# Patient Record
Sex: Female | Born: 1985
Health system: Southern US, Community
[De-identification: ages and names within clinical notes are randomized; demographics above are authoritative.]

## PROBLEM LIST (undated history)

## (undated) DIAGNOSIS — D649 Anemia, unspecified: Secondary | ICD-10-CM

---

## 2012-11-13 ENCOUNTER — Emergency Department (HOSPITAL_BASED_OUTPATIENT_CLINIC_OR_DEPARTMENT_OTHER)
Admission: EM | Admit: 2012-11-13 | Discharge: 2012-11-13 | Disposition: A | Discharge status: Home / Self Care | Payer: Self-pay | Attending: Emergency Medicine | Admitting: Emergency Medicine

## 2012-11-13 ENCOUNTER — Encounter (HOSPITAL_BASED_OUTPATIENT_CLINIC_OR_DEPARTMENT_OTHER): Payer: Self-pay | Admitting: *Deleted

## 2012-11-13 DIAGNOSIS — IMO0001 Reserved for inherently not codable concepts without codable children: Secondary | ICD-10-CM | POA: Insufficient documentation

## 2012-11-13 DIAGNOSIS — L259 Unspecified contact dermatitis, unspecified cause: Secondary | ICD-10-CM | POA: Insufficient documentation

## 2012-11-13 DIAGNOSIS — N3090 Cystitis, unspecified without hematuria: Secondary | ICD-10-CM | POA: Insufficient documentation

## 2012-11-13 DIAGNOSIS — L239 Allergic contact dermatitis, unspecified cause: Secondary | ICD-10-CM

## 2012-11-13 DIAGNOSIS — S30860A Insect bite (nonvenomous) of lower back and pelvis, initial encounter: Secondary | ICD-10-CM | POA: Insufficient documentation

## 2012-11-13 DIAGNOSIS — Y939 Activity, unspecified: Secondary | ICD-10-CM | POA: Insufficient documentation

## 2012-11-13 DIAGNOSIS — W57XXXA Bitten or stung by nonvenomous insect and other nonvenomous arthropods, initial encounter: Secondary | ICD-10-CM | POA: Insufficient documentation

## 2012-11-13 DIAGNOSIS — R3915 Urgency of urination: Secondary | ICD-10-CM | POA: Insufficient documentation

## 2012-11-13 DIAGNOSIS — S90569A Insect bite (nonvenomous), unspecified ankle, initial encounter: Secondary | ICD-10-CM | POA: Insufficient documentation

## 2012-11-13 DIAGNOSIS — Y9289 Other specified places as the place of occurrence of the external cause: Secondary | ICD-10-CM | POA: Insufficient documentation

## 2012-11-13 DIAGNOSIS — R300 Dysuria: Secondary | ICD-10-CM | POA: Insufficient documentation

## 2012-11-13 DIAGNOSIS — R350 Frequency of micturition: Secondary | ICD-10-CM | POA: Insufficient documentation

## 2012-11-13 DIAGNOSIS — L299 Pruritus, unspecified: Secondary | ICD-10-CM | POA: Insufficient documentation

## 2012-11-13 DIAGNOSIS — Z862 Personal history of diseases of the blood and blood-forming organs and certain disorders involving the immune mechanism: Secondary | ICD-10-CM | POA: Insufficient documentation

## 2012-11-13 HISTORY — DX: Anemia, unspecified: D64.9

## 2012-11-13 LAB — URINE MICROSCOPIC-ADD ON

## 2012-11-13 LAB — URINALYSIS, ROUTINE W REFLEX MICROSCOPIC
Bilirubin Urine: NEGATIVE
Ketones, ur: 15 mg/dL — AB
Nitrite: NEGATIVE
Protein, ur: NEGATIVE mg/dL
pH: 6.5 (ref 5.0–8.0)

## 2012-11-13 MED ORDER — PHENAZOPYRIDINE HCL 200 MG PO TABS: 200.0000 mg | ORAL_TABLET | Freq: Three times a day (TID) | ORAL | Status: DC

## 2012-11-13 MED ORDER — KETOROLAC TROMETHAMINE 60 MG/2ML IM SOLN: 60.0000 mg | Freq: Once | INTRAMUSCULAR | Status: DC

## 2012-11-13 MED ORDER — SULFAMETHOXAZOLE-TRIMETHOPRIM 800-160 MG PO TABS: 1.0000 | ORAL_TABLET | Freq: Two times a day (BID) | ORAL | Status: DC

## 2012-11-13 MED ORDER — CETIRIZINE-PSEUDOEPHEDRINE ER 5-120 MG PO TB12: 1.0000 | ORAL_TABLET | Freq: Every day | ORAL | Status: DC

## 2012-11-13 NOTE — ED Provider Notes (Signed)
History     CSN: [id number]  Arrival date & time 11/13/12  2107   First MD Initiated Contact with Patient 11/13/12 2238      Chief Complaint  Patient presents with  . Rash    (Consider location/radiation/quality/duration/timing/severity/associated sxs/prior treatment) Patient is a 27 y.o. female presenting with rash. The history is provided by the patient.  Rash Pain severity:  No pain Chronicity:  New Relieved by:  Nothing Worsened by:  Nothing tried Ineffective treatments:  None tried Associated symptoms: dysuria   Associated symptoms: no chills, no fever, no nausea, no vaginal bleeding, no vaginal discharge and no vomiting    Maureen Miller is a 27 y.o. female who presents to the ED with a rash. The rash started a few days ago. She has been outside a lot and thinks she has insect bites. She complains of itching. The rash is located on the arms, legs and abdomen. Patient also complains of frequent urination, urgency and dysuria. She request check for UTI.  Past Medical History  Diagnosis Date  . Anemia     History reviewed. No pertinent past surgical history.  History reviewed. No pertinent family history.  History  Substance Use Topics  . Smoking status: Never Smoker   . Smokeless tobacco: Not on file  . Alcohol Use: No    OB History   Grav Para Term Preterm Abortions TAB SAB Ect Mult Living                  Review of Systems  Constitutional: Negative for fever and chills.  HENT: Negative for neck pain.   Gastrointestinal: Negative for nausea, vomiting and abdominal pain.  Genitourinary: Positive for dysuria, urgency and frequency. Negative for vaginal bleeding and vaginal discharge.  Skin: Positive for rash.  Neurological: Negative for headaches.  Psychiatric/Behavioral: The patient is not nervous/anxious.     Allergies  Review of patient's allergies indicates no known allergies.  Home Medications  No current outpatient prescriptions on file.  BP  112/74  Pulse 80  Temp(Src) 97.4 F (36.3 C) (Oral)  Resp 18  Ht 5\' 6"  (1.676 m)  Wt 175 lb (79.379 kg)  BMI 28.26 kg/m2  SpO2 100%  LMP 09/29/2012  Physical Exam  Nursing note and vitals reviewed. Constitutional: She is oriented to person, place, and time. She appears well-developed and well-nourished. No distress.  HENT:  Head: Normocephalic and atraumatic.  Eyes: Conjunctivae and EOM are normal.  Neck: Normal range of motion. Neck supple.  Cardiovascular: Normal rate and regular rhythm.   Pulmonary/Chest: Effort normal and breath sounds normal.  Musculoskeletal:  Raised red areas arms legs and abdomen consistent with allergic reaction to insect bites. No signs of infection.   Neurological: She is alert and oriented to person, place, and time. No cranial nerve deficit.  Skin: Skin is warm and dry.   Results for orders placed during the hospital encounter of 11/13/12 (from the past 24 hour(s))  URINALYSIS, ROUTINE W REFLEX MICROSCOPIC     Status: Abnormal   Collection Time    11/13/12 10:54 PM      Result Value Range   Color, Urine YELLOW  YELLOW   APPearance CLEAR  CLEAR   Specific Gravity, Urine 1.017  1.005 - 1.030   pH 6.5  5.0 - 8.0   Glucose, UA NEGATIVE  NEGATIVE mg/dL   Hgb urine dipstick SMALL (*) NEGATIVE   Bilirubin Urine NEGATIVE  NEGATIVE   Ketones, ur 15 (*) NEGATIVE mg/dL  Protein, ur NEGATIVE  NEGATIVE mg/dL   Urobilinogen, UA 0.2  0.0 - 1.0 mg/dL   Nitrite NEGATIVE  NEGATIVE   Leukocytes, UA SMALL (*) NEGATIVE  URINE MICROSCOPIC-ADD ON     Status: Abnormal   Collection Time    11/13/12 10:54 PM      Result Value Range   Squamous Epithelial / LPF FEW (*) RARE   WBC, UA 3-6  <3 WBC/hpf   RBC / HPF 0-2  <3 RBC/hpf   Bacteria, UA FEW (*) RARE    ED Course  Procedures (including critical care time)   MDM  27 y.o. female with allergic dermitis due to insect bites. UTI symptoms. Will treat allergic dermitis and UTI.  Discussed with the patient  clinical and lab findings and plan of care. All questioned fully answered. She will return if any problems arise.    Medication List    TAKE these medications       cetirizine-pseudoephedrine 5-120 MG per tablet  Commonly known as:  ZYRTEC-D  Take 1 tablet by mouth daily.     phenazopyridine 200 MG tablet  Commonly known as:  PYRIDIUM  Take 1 tablet (200 mg total) by mouth 3 (three) times daily.     sulfamethoxazole-trimethoprim 800-160 MG per tablet  Commonly known as:  SEPTRA DS  Take 1 tablet by mouth every 12 (twelve) hours.               [place], [place] 11/14/12 [phone]

## 2012-11-13 NOTE — ED Notes (Addendum)
Reddened, raised areas on arms and torso for 3 days. + itching. Also c/o burning when urinating, urinary frequency and lower back pain.

## 2012-11-13 NOTE — ED Notes (Addendum)
Raised red areas to body x 3-4 days. +itching. Grandmother has similar rash "but hers are smaller"

## 2012-11-14 NOTE — ED Provider Notes (Signed)
Medical screening examination/treatment/procedure(s) were performed by non-physician practitioner and as supervising physician I was immediately available for consultation/collaboration.   Carleene Cooper III, MD 11/14/12 1331

## 2012-11-15 LAB — URINE CULTURE: Colony Count: NO GROWTH

## 2013-04-07 ENCOUNTER — Emergency Department (HOSPITAL_BASED_OUTPATIENT_CLINIC_OR_DEPARTMENT_OTHER)
Admission: EM | Admit: 2013-04-07 | Discharge: 2013-04-07 | Disposition: A | Discharge status: Home / Self Care | Payer: Self-pay | Attending: Emergency Medicine | Admitting: Emergency Medicine

## 2013-04-07 ENCOUNTER — Encounter (HOSPITAL_BASED_OUTPATIENT_CLINIC_OR_DEPARTMENT_OTHER): Payer: Self-pay | Admitting: Emergency Medicine

## 2013-04-07 DIAGNOSIS — R102 Pelvic and perineal pain: Secondary | ICD-10-CM

## 2013-04-07 DIAGNOSIS — N949 Unspecified condition associated with female genital organs and menstrual cycle: Secondary | ICD-10-CM | POA: Insufficient documentation

## 2013-04-07 DIAGNOSIS — Z862 Personal history of diseases of the blood and blood-forming organs and certain disorders involving the immune mechanism: Secondary | ICD-10-CM | POA: Insufficient documentation

## 2013-04-07 DIAGNOSIS — N898 Other specified noninflammatory disorders of vagina: Secondary | ICD-10-CM | POA: Insufficient documentation

## 2013-04-07 DIAGNOSIS — R112 Nausea with vomiting, unspecified: Secondary | ICD-10-CM | POA: Insufficient documentation

## 2013-04-07 DIAGNOSIS — Z3202 Encounter for pregnancy test, result negative: Secondary | ICD-10-CM | POA: Insufficient documentation

## 2013-04-07 LAB — BASIC METABOLIC PANEL
CO2: 28 mEq/L (ref 19–32)
Chloride: 101 mEq/L (ref 96–112)
GFR calc Af Amer: >90 mL/min (ref >90)
Potassium: 3.8 mEq/L (ref 3.5–5.1)
Sodium: 139 mEq/L (ref 135–145)

## 2013-04-07 LAB — URINALYSIS, ROUTINE W REFLEX MICROSCOPIC
Bilirubin Urine: NEGATIVE
Glucose, UA: NEGATIVE mg/dL
Ketones, ur: 15 mg/dL — AB
Protein, ur: 30 mg/dL — AB
pH: 6 (ref 5.0–8.0)

## 2013-04-07 LAB — CBC WITH DIFFERENTIAL/PLATELET
Basophils Absolute: 0 10*3/uL (ref 0.0–0.1)
Basophils Relative: 0 % (ref 0–1)
Hemoglobin: 14.2 g/dL (ref 12.0–15.0)
Lymphocytes Relative: 12 % (ref 12–46)
MCH: 28 pg (ref 26.0–34.0)
MCHC: 33.6 g/dL (ref 30.0–36.0)
MCV: 83.4 fL (ref 78.0–100.0)
RBC: 5.07 MIL/uL (ref 3.87–5.11)

## 2013-04-07 LAB — WET PREP, GENITAL
Trich, Wet Prep: NONE SEEN
Yeast Wet Prep HPF POC: NONE SEEN

## 2013-04-07 LAB — URINE MICROSCOPIC-ADD ON

## 2013-04-07 MED ORDER — CEFTRIAXONE SODIUM 250 MG IJ SOLR
250.0000 mg | Freq: Once | INTRAMUSCULAR | Status: AC
  Administered 2013-04-07: 250 mg via INTRAMUSCULAR
  Filled 2013-04-07: qty 250

## 2013-04-07 MED ORDER — DOXYCYCLINE HYCLATE 100 MG PO CAPS: 100.0000 mg | ORAL_CAPSULE | Freq: Two times a day (BID) | ORAL | Status: DC

## 2013-04-07 MED ORDER — ONDANSETRON 4 MG PO TBDP: 4.0000 mg | ORAL_TABLET | Freq: Three times a day (TID) | ORAL | Status: DC | PRN

## 2013-04-07 MED ORDER — MORPHINE SULFATE 4 MG/ML IJ SOLN
4.0000 mg | Freq: Once | INTRAMUSCULAR | Status: AC
  Administered 2013-04-07: 4 mg via INTRAVENOUS
  Filled 2013-04-07: qty 1

## 2013-04-07 MED ORDER — LIDOCAINE HCL (PF) 1 % IJ SOLN
INTRAMUSCULAR | Status: AC
  Administered 2013-04-07: 1 mL
  Filled 2013-04-07: qty 5

## 2013-04-07 MED ORDER — ONDANSETRON HCL 4 MG/2ML IJ SOLN
4.0000 mg | Freq: Once | INTRAMUSCULAR | Status: AC
  Administered 2013-04-07: 4 mg via INTRAVENOUS
  Filled 2013-04-07: qty 2

## 2013-04-07 MED ORDER — SODIUM CHLORIDE 0.9 % IV SOLN
INTRAVENOUS | Status: DC
  Administered 2013-04-07: 1000 mL via INTRAVENOUS

## 2013-04-07 NOTE — ED Provider Notes (Signed)
CSN: [id number]     Arrival date & time 04/07/13  1721 History   First MD Initiated Contact with Patient 04/07/13 1730     Chief Complaint  Patient presents with  . Abdominal Pain   (Consider location/radiation/quality/duration/timing/severity/associated sxs/prior Treatment) Patient is a 27 y.o. female presenting with abdominal pain. The history is provided by the patient. No language interpreter was used.  Abdominal Pain Pain location:  Suprapubic Pain quality: throbbing   Pain radiates to:  Does not radiate Pain severity:  Severe Onset quality:  Sudden Duration:  1 hour Timing:  Constant Progression:  Worsening Chronicity:  New Relieved by:  None tried Worsened by:  Movement and position changes Ineffective treatments:  None tried Associated symptoms: nausea and vomiting   Associated symptoms: no vaginal bleeding and no vaginal discharge     Past Medical History  Diagnosis Date  . Anemia    History reviewed. No pertinent past surgical history. History reviewed. No pertinent family history. History  Substance Use Topics  . Smoking status: Never Smoker   . Smokeless tobacco: Not on file  . Alcohol Use: No   OB History   Grav Para Term Preterm Abortions TAB SAB Ect Mult Living                 Review of Systems  Gastrointestinal: Positive for nausea, vomiting and abdominal pain.  Genitourinary: Positive for pelvic pain. Negative for vaginal bleeding and vaginal discharge.  All other systems reviewed and are negative.    Allergies  Review of patient's allergies indicates no known allergies.  Home Medications  No current outpatient prescriptions on file. BP 123/83  Pulse 90  Temp(Src) 97.8 F (36.6 C) (Oral)  Resp 16  Ht 5\' 7"  (1.702 m)  Wt 160 lb (72.576 kg)  BMI 25.05 kg/m2  SpO2 98%  LMP 03/25/2013 Physical Exam  Nursing note and vitals reviewed. Constitutional: She is oriented to person, place, and time. She appears well-developed and  well-nourished.  HENT:  Head: Normocephalic.  Eyes: Pupils are equal, round, and reactive to light.  Neck: Neck supple.  Cardiovascular: Normal rate and regular rhythm.   Pulmonary/Chest: Effort normal and breath sounds normal.  Abdominal: Soft. Bowel sounds are normal. There is tenderness.  Genitourinary: There is no rash on the right labia. There is no rash on the left labia. Cervix exhibits motion tenderness and discharge. Left adnexum displays tenderness. Vaginal discharge found.  Musculoskeletal: She exhibits no edema and no tenderness.  Lymphadenopathy:    She has no cervical adenopathy.  Neurological: She is alert and oriented to person, place, and time.  Skin: Skin is warm and dry.  Psychiatric: She has a normal mood and affect. Her behavior is normal. Judgment and thought content normal.    ED Course  Procedures (including critical care time   Labs Review Labs Reviewed  URINALYSIS, ROUTINE W REFLEX MICROSCOPIC  PREGNANCY, URINE  CBC WITH DIFFERENTIAL  BASIC METABOLIC PANEL   Imaging Review No results found.  EKG Interpretation   None     Laboratory results reviewed and shared with patient. Patient is sexually active, no protection, monogamous relationship.  Patient with purulent vaginal discharge on exam, mild left adnexal tenderness.  Pending STD labs.  Will cover with rocephin and doxycycline.  Patient to follow-up with her PCP/GYN, or [facility]. MDM   Pelvic pain.   Jimmye Norman, NP 04/07/13 [phone]

## 2013-04-07 NOTE — ED Notes (Signed)
Pt c/o abd pain with n/v/d x 30 mins

## 2013-04-07 NOTE — ED Notes (Signed)
Pelvic cart set up at bedside  

## 2013-04-07 NOTE — ED Notes (Signed)
Pt is unable to void. 

## 2013-04-07 NOTE — ED Notes (Signed)
N/v, abd pain onset 30 minutes pta, diarrhea x 1

## 2013-04-08 LAB — URINE CULTURE: Culture: NO GROWTH

## 2013-04-11 NOTE — ED Provider Notes (Signed)
Medical screening examination/treatment/procedure(s) were performed by non-physician practitioner and as supervising physician I was immediately available for consultation/collaboration.  EKG Interpretation   None        Shelda Jakes, MD 04/11/13 0730

## 2014-09-01 ENCOUNTER — Encounter (HOSPITAL_BASED_OUTPATIENT_CLINIC_OR_DEPARTMENT_OTHER): Payer: Self-pay | Admitting: *Deleted

## 2014-09-01 ENCOUNTER — Emergency Department (HOSPITAL_BASED_OUTPATIENT_CLINIC_OR_DEPARTMENT_OTHER): Payer: Medicaid Other

## 2014-09-01 ENCOUNTER — Emergency Department (HOSPITAL_BASED_OUTPATIENT_CLINIC_OR_DEPARTMENT_OTHER)
Admission: EM | Admit: 2014-09-01 | Discharge: 2014-09-01 | Disposition: A | Discharge status: Home / Self Care | Payer: Medicaid Other | Attending: Emergency Medicine | Admitting: Emergency Medicine

## 2014-09-01 DIAGNOSIS — Z792 Long term (current) use of antibiotics: Secondary | ICD-10-CM | POA: Diagnosis not present

## 2014-09-01 DIAGNOSIS — Y9389 Activity, other specified: Secondary | ICD-10-CM | POA: Diagnosis not present

## 2014-09-01 DIAGNOSIS — Y92410 Unspecified street and highway as the place of occurrence of the external cause: Secondary | ICD-10-CM | POA: Insufficient documentation

## 2014-09-01 DIAGNOSIS — Z862 Personal history of diseases of the blood and blood-forming organs and certain disorders involving the immune mechanism: Secondary | ICD-10-CM | POA: Insufficient documentation

## 2014-09-01 DIAGNOSIS — Y998 Other external cause status: Secondary | ICD-10-CM | POA: Insufficient documentation

## 2014-09-01 DIAGNOSIS — S0990XA Unspecified injury of head, initial encounter: Secondary | ICD-10-CM | POA: Insufficient documentation

## 2014-09-01 DIAGNOSIS — Z79899 Other long term (current) drug therapy: Secondary | ICD-10-CM | POA: Insufficient documentation

## 2014-09-01 DIAGNOSIS — S199XXA Unspecified injury of neck, initial encounter: Secondary | ICD-10-CM | POA: Insufficient documentation

## 2014-09-01 MED ORDER — HYDROCODONE-ACETAMINOPHEN 5-325 MG PO TABS: 1.0000 | ORAL_TABLET | Freq: Four times a day (QID) | ORAL | Status: DC | PRN

## 2014-09-01 MED ORDER — ACETAMINOPHEN 325 MG PO TABS
650.0000 mg | ORAL_TABLET | Freq: Once | ORAL | Status: AC
  Administered 2014-09-01: 650 mg via ORAL
  Filled 2014-09-01: qty 2

## 2014-09-01 NOTE — Discharge Instructions (Signed)
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
## 2014-09-03 ENCOUNTER — Emergency Department (HOSPITAL_BASED_OUTPATIENT_CLINIC_OR_DEPARTMENT_OTHER)
Admission: EM | Admit: 2014-09-03 | Discharge: 2014-09-03 | Disposition: A | Discharge status: Home / Self Care | Payer: Medicaid Other | Attending: Emergency Medicine | Admitting: Emergency Medicine

## 2014-09-03 ENCOUNTER — Emergency Department (HOSPITAL_BASED_OUTPATIENT_CLINIC_OR_DEPARTMENT_OTHER): Payer: Medicaid Other

## 2014-09-03 ENCOUNTER — Encounter (HOSPITAL_BASED_OUTPATIENT_CLINIC_OR_DEPARTMENT_OTHER): Payer: Self-pay | Admitting: *Deleted

## 2014-09-03 DIAGNOSIS — Z3202 Encounter for pregnancy test, result negative: Secondary | ICD-10-CM | POA: Diagnosis not present

## 2014-09-03 DIAGNOSIS — R519 Headache, unspecified: Secondary | ICD-10-CM

## 2014-09-03 DIAGNOSIS — S3992XA Unspecified injury of lower back, initial encounter: Secondary | ICD-10-CM | POA: Diagnosis not present

## 2014-09-03 DIAGNOSIS — R0789 Other chest pain: Secondary | ICD-10-CM

## 2014-09-03 DIAGNOSIS — M549 Dorsalgia, unspecified: Secondary | ICD-10-CM

## 2014-09-03 DIAGNOSIS — R000 Tachycardia, unspecified: Secondary | ICD-10-CM | POA: Insufficient documentation

## 2014-09-03 DIAGNOSIS — Y9389 Activity, other specified: Secondary | ICD-10-CM | POA: Diagnosis not present

## 2014-09-03 DIAGNOSIS — Y92410 Unspecified street and highway as the place of occurrence of the external cause: Secondary | ICD-10-CM | POA: Diagnosis not present

## 2014-09-03 DIAGNOSIS — S29001A Unspecified injury of muscle and tendon of front wall of thorax, initial encounter: Secondary | ICD-10-CM | POA: Insufficient documentation

## 2014-09-03 DIAGNOSIS — Y998 Other external cause status: Secondary | ICD-10-CM | POA: Diagnosis not present

## 2014-09-03 DIAGNOSIS — R51 Headache: Secondary | ICD-10-CM

## 2014-09-03 DIAGNOSIS — Z792 Long term (current) use of antibiotics: Secondary | ICD-10-CM | POA: Insufficient documentation

## 2014-09-03 DIAGNOSIS — S0990XA Unspecified injury of head, initial encounter: Secondary | ICD-10-CM | POA: Diagnosis present

## 2014-09-03 LAB — URINALYSIS, ROUTINE W REFLEX MICROSCOPIC
BILIRUBIN URINE: NEGATIVE
GLUCOSE, UA: NEGATIVE mg/dL
HGB URINE DIPSTICK: NEGATIVE
KETONES UR: NEGATIVE mg/dL
LEUKOCYTES UA: NEGATIVE
NITRITE: NEGATIVE
Protein, ur: NEGATIVE mg/dL
Specific Gravity, Urine: 1.026 (ref 1.005–1.030)
Urobilinogen, UA: 0.2 mg/dL (ref 0.0–1.0)
pH: 5.5 (ref 5.0–8.0)

## 2014-09-03 LAB — PREGNANCY, URINE: PREG TEST UR: NEGATIVE

## 2014-09-03 MED ORDER — OXYCODONE-ACETAMINOPHEN 5-325 MG PO TABS
2.0000 | ORAL_TABLET | Freq: Once | ORAL | Status: AC
  Administered 2014-09-03: 2 via ORAL
  Filled 2014-09-03: qty 2

## 2014-09-03 MED ORDER — DIAZEPAM 5 MG PO TABS
5.0000 mg | ORAL_TABLET | Freq: Once | ORAL | Status: AC
  Administered 2014-09-03: 5 mg via ORAL
  Filled 2014-09-03: qty 1

## 2014-09-03 MED ORDER — DIAZEPAM 5 MG PO TABS: 5.0000 mg | ORAL_TABLET | Freq: Two times a day (BID) | ORAL | Status: DC | PRN

## 2014-09-03 NOTE — Discharge Instructions (Signed)
Take Valium as needed as directed for muscle spasm. No driving or operating heavy machinery while taking valium. This medication may cause drowsiness. Take the hydrocodone that was prescribed to you at your last visit for your pain. Rest, avoid heavy lifting or hard physical activity for the next few days.  Chest Wall Pain Chest wall pain is pain in or around the bones and muscles of your chest. It may take up to 6 weeks to get better. It may take longer if you must stay physically active in your work and activities.  CAUSES  Chest wall pain may happen on its own. However, it may be caused by:  A viral illness like the flu.  Injury.  Coughing.  Exercise.  Arthritis.  Fibromyalgia.  Shingles. HOME CARE INSTRUCTIONS   Avoid overtiring physical activity. Try not to strain or perform activities that cause pain. This includes any activities using your chest or your abdominal and side muscles, especially if heavy weights are used.  Put ice on the sore area.  Put ice in a plastic bag.  Place a towel between your skin and the bag.  Leave the ice on for 15-20 minutes per hour while awake for the first 2 days.  Only take over-the-counter or prescription medicines for pain, discomfort, or fever as directed by your caregiver. SEEK IMMEDIATE MEDICAL CARE IF:   Your pain increases, or you are very uncomfortable.  You have a fever.  Your chest pain becomes worse.  You have new, unexplained symptoms.  You have nausea or vomiting.  You feel sweaty or lightheaded.  You have a cough with phlegm (sputum), or you cough up blood. MAKE SURE YOU:   Understand these instructions.  Will watch your condition.  Will get help right away if you are not doing well or get worse. Document Released: 05/12/2005 Document Revised: 08/04/2011 Document Reviewed: 01/06/2011 [facility] Patient Information 2015 [place], [place]. This information is not intended to replace advice given to you by your  health care provider. Make sure you discuss any questions you have with your health care provider.  Motor Vehicle Collision It is common to have multiple bruises and sore muscles after a motor vehicle collision (MVC). These tend to feel worse for the first 24 hours. You may have the most stiffness and soreness over the first several hours. You may also feel worse when you wake up the first morning after your collision. After this point, you will usually begin to improve with each day. The speed of improvement often depends on the severity of the collision, the number of injuries, and the location and nature of these injuries. HOME CARE INSTRUCTIONS  Put ice on the injured area.  Put ice in a plastic bag.  Place a towel between your skin and the bag.  Leave the ice on for 15-20 minutes, 3-4 times a day, or as directed by your health care provider.  Drink enough fluids to keep your urine clear or pale yellow. Do not drink alcohol.  Take a warm shower or bath once or twice a day. This will increase blood flow to sore muscles.  You may return to activities as directed by your caregiver. Be careful when lifting, as this may aggravate neck or back pain.  Only take over-the-counter or prescription medicines for pain, discomfort, or fever as directed by your caregiver. Do not use aspirin. This may increase bruising and bleeding. SEEK IMMEDIATE MEDICAL CARE IF:  You have numbness, tingling, or weakness in the arms or  legs.  You develop severe headaches not relieved with medicine.  You have severe neck pain, especially tenderness in the middle of the back of your neck.  You have changes in bowel or bladder control.  There is increasing pain in any area of the body.  You have shortness of breath, light-headedness, dizziness, or fainting.  You have chest pain.  You feel sick to your stomach (nauseous), throw up (vomit), or sweat.  You have increasing abdominal discomfort.  There is blood  in your urine, stool, or vomit.  You have pain in your shoulder (shoulder strap areas).  You feel your symptoms are getting worse. MAKE SURE YOU:  Understand these instructions.  Will watch your condition.  Will get help right away if you are not doing well or get worse. Document Released: 05/12/2005 Document Revised: 09/26/2013 Document Reviewed: 10/09/2010 [facility] Patient Information 2015 [place], [place]. This information is not intended to replace advice given to you by your health care provider. Make sure you discuss any questions you have with your health care provider.  Musculoskeletal Pain Musculoskeletal pain is muscle and boney aches and pains. These pains can occur in any part of the body. Your caregiver may treat you without knowing the cause of the pain. They may treat you if blood or urine tests, X-rays, and other tests were normal.  CAUSES There is often not a definite cause or reason for these pains. These pains may be caused by a type of germ (virus). The discomfort may also come from overuse. Overuse includes working out too hard when your body is not fit. Boney aches also come from weather changes. Bone is sensitive to atmospheric pressure changes. HOME CARE INSTRUCTIONS   Ask when your test results will be ready. Make sure you get your test results.  Only take over-the-counter or prescription medicines for pain, discomfort, or fever as directed by your caregiver. If you were given medications for your condition, do not drive, operate machinery or power tools, or sign legal documents for 24 hours. Do not drink alcohol. Do not take sleeping pills or other medications that may interfere with treatment.  Continue all activities unless the activities cause more pain. When the pain lessens, slowly resume normal activities. Gradually increase the intensity and duration of the activities or exercise.  During periods of severe pain, bed rest may be helpful. Lay or sit in any  position that is comfortable.  Putting ice on the injured area.  Put ice in a bag.  Place a towel between your skin and the bag.  Leave the ice on for 15 to 20 minutes, 3 to 4 times a day.  Follow up with your caregiver for continued problems and no reason can be found for the pain. If the pain becomes worse or does not go away, it may be necessary to repeat tests or do additional testing. Your caregiver may need to look further for a possible cause. SEEK IMMEDIATE MEDICAL CARE IF:  You have pain that is getting worse and is not relieved by medications.  You develop chest pain that is associated with shortness or breath, sweating, feeling sick to your stomach (nauseous), or throw up (vomit).  Your pain becomes localized to the abdomen.  You develop any new symptoms that seem different or that concern you. MAKE SURE YOU:   Understand these instructions.  Will watch your condition.  Will get help right away if you are not doing well or get worse. Document Released: 05/12/2005 Document Revised:  08/04/2011 Document Reviewed: 01/14/2013 [facility] Patient Information 2015 [place]. This information is not intended to replace advice given to you by your health care provider. Make sure you discuss any questions you have with your health care provider.
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
## 2015-01-31 ENCOUNTER — Encounter (HOSPITAL_BASED_OUTPATIENT_CLINIC_OR_DEPARTMENT_OTHER): Payer: Self-pay | Admitting: *Deleted

## 2015-01-31 ENCOUNTER — Emergency Department (HOSPITAL_BASED_OUTPATIENT_CLINIC_OR_DEPARTMENT_OTHER)
Admission: EM | Admit: 2015-01-31 | Discharge: 2015-01-31 | Disposition: A | Discharge status: Home / Self Care | Payer: Medicaid Other | Attending: Emergency Medicine | Admitting: Emergency Medicine

## 2015-01-31 DIAGNOSIS — R21 Rash and other nonspecific skin eruption: Secondary | ICD-10-CM | POA: Diagnosis not present

## 2015-01-31 DIAGNOSIS — Z862 Personal history of diseases of the blood and blood-forming organs and certain disorders involving the immune mechanism: Secondary | ICD-10-CM | POA: Diagnosis not present

## 2015-01-31 MED ORDER — SULFAMETHOXAZOLE-TRIMETHOPRIM 800-160 MG PO TABS: 1.0000 | ORAL_TABLET | Freq: Two times a day (BID) | ORAL | Status: AC

## 2015-01-31 MED ORDER — MUPIROCIN CALCIUM 2 % EX CREA: 1.0000 "application " | TOPICAL_CREAM | Freq: Two times a day (BID) | CUTANEOUS | Status: DC

## 2015-01-31 NOTE — Discharge Instructions (Signed)
Rash A rash is a change in the color or texture of your skin. There are many different types of rashes. You may have other problems that accompany your rash. CAUSES   Infections.  Allergic reactions. This can include allergies to pets or foods.  Certain medicines.  Exposure to certain chemicals, soaps, or cosmetics.  Heat.  Exposure to poisonous plants.  Tumors, both cancerous and noncancerous. SYMPTOMS   Redness.  Scaly skin.  Itchy skin.  Dry or cracked skin.  Bumps.  Blisters.  Pain. DIAGNOSIS  Your caregiver may do a physical exam to determine what type of rash you have. A skin sample (biopsy) may be taken and examined under a microscope. TREATMENT  Treatment depends on the type of rash you have. Your caregiver may prescribe certain medicines. For serious conditions, you may need to see a skin doctor (dermatologist). HOME CARE INSTRUCTIONS   Avoid the substance that caused your rash.  Do not scratch your rash. This can cause infection.  You may take cool baths to help stop itching.  Only take over-the-counter or prescription medicines as directed by your caregiver.  Keep all follow-up appointments as directed by your caregiver. SEEK IMMEDIATE MEDICAL CARE IF:  You have increasing pain, swelling, or redness.  You have a fever.  You have new or severe symptoms.  You have body aches, diarrhea, or vomiting.  Your rash is not better after 3 days. MAKE SURE YOU:  Understand these instructions.  Will watch your condition.  Will get help right away if you are not doing well or get worse. Document Released: 05/02/2002 Document Revised: 08/04/2011 Document Reviewed: 02/24/2011 [facility] Patient Information 2015 [place], [place]. This information is not intended to replace advice given to you by your health care provider. Make sure you discuss any questions you have with your health care provider.  Return to ED if rash spreads, fevers, chills, vomiting  occur.

## 2015-01-31 NOTE — ED Notes (Addendum)
Pt c/o rash to neck x 2 days described as burning pain

## 2015-01-31 NOTE — ED Provider Notes (Signed)
CSN: [id number]     Arrival date & time 01/31/15  1749 History   First MD Initiated Contact with Patient 01/31/15 1817     Chief Complaint  Patient presents with  . Rash     (Consider location/radiation/quality/duration/timing/severity/associated sxs/prior Treatment) HPI Comments: Maureen Miller is a 29 y.o F with no significant past medical history who presents to the emergency department today complaining of rash on neck 2 days. Patient states that the rash is burning. Patient applied Noxzema to rash with no relief. Denies contact with poison ivy, poison oak, new jewelry. Patient concerned that this is shingles. Denies fever, chills, vomiting. Patient is up-to-date on immunizations. Patient had chickenpox as a child.  Patient is a 29 y.o. female presenting with rash. The history is provided by the patient.  Rash Location:  Head/neck   Past Medical History  Diagnosis Date  . Anemia    Past Surgical History  Procedure Laterality Date  . Cesarean section     History reviewed. No pertinent family history. Social History  Substance Use Topics  . Smoking status: Never Smoker   . Smokeless tobacco: Never Used  . Alcohol Use: No   OB History    No data available     Review of Systems  Skin: Positive for rash.  All other systems reviewed and are negative.     Allergies  Review of patient's allergies indicates no known allergies.  Home Medications   Prior to Admission medications   Medication Sig Start Date End Date Taking? Authorizing Provider  mupirocin cream (BACTROBAN) 2 % Apply 1 application topically 2 (two) times daily. 01/31/15   Jhanvi Drakeford Tripp Akera Snowberger, PA-C  sulfamethoxazole-trimethoprim (BACTRIM DS,SEPTRA DS) 800-160 MG per tablet Take 1 tablet by mouth 2 (two) times daily. 01/31/15 02/07/15  Amberleigh Gerken Tripp Freemon Binford, PA-C   BP 123/76 mmHg  Pulse 72  Temp(Src) 98.4 F (36.9 C)  Resp 18  Ht  (1.676 m)  Wt 200 lb (90.719 kg)  BMI 32.30 kg/m2  SpO2 100%  LMP  01/31/2015 Physical Exam  Constitutional: Maureen Miller is oriented to person, place, and time. Maureen Miller appears well-developed and well-nourished. No distress.  HENT:  Head: Normocephalic and atraumatic.  Eyes: Conjunctivae are normal. Right eye exhibits no discharge. Left eye exhibits no discharge. No scleral icterus.  Cardiovascular: Normal rate.   Pulmonary/Chest: Effort normal.  Abdominal: Soft.  Neurological: Maureen Miller is alert and oriented to person, place, and time. Coordination normal.  Skin: Skin is warm and dry. Rash noted. Maureen Miller is not diaphoretic. No erythema. No pallor.  Papular and vesicular rash above the manubrium. Cluster of 6 or 7 vesicles. Rash crosses the midline. Nonerythematous or edematous. Vesicles are less than 0.5 cm in size each.  Psychiatric: Maureen Miller has a normal mood and affect. Her behavior is normal.  Nursing note and vitals reviewed.   ED Course  Procedures (including critical care time) Labs Review Labs Reviewed - No data to display  Imaging Review No results found. I have personally reviewed and evaluated these images and lab results as part of my medical decision-making.   EKG Interpretation None      MDM   Final diagnoses:  Rash of neck    Patient seen for rash on neck 2 days. Rash crosses midline does not follow a dermatome. Shingles unlikely. Rash is vesicular in nature possibly bacterial. We will cover with Bactrim and topical mupirocin ointment. Recommend follow-up with dermatology in one week if symptoms aren't improved. Return precautions outlined in patient  discharge instructions.    Lester Kinsman [place], PA-C 01/31/15 [id number]  Mirian Mo, MD 02/12/15 [phone]

## 2015-03-31 ENCOUNTER — Emergency Department (HOSPITAL_BASED_OUTPATIENT_CLINIC_OR_DEPARTMENT_OTHER)
Admission: EM | Admit: 2015-03-31 | Discharge: 2015-03-31 | Disposition: A | Discharge status: Home / Self Care | Payer: Medicaid Other | Attending: Emergency Medicine | Admitting: Emergency Medicine

## 2015-03-31 ENCOUNTER — Encounter (HOSPITAL_BASED_OUTPATIENT_CLINIC_OR_DEPARTMENT_OTHER): Payer: Self-pay

## 2015-03-31 ENCOUNTER — Emergency Department (HOSPITAL_BASED_OUTPATIENT_CLINIC_OR_DEPARTMENT_OTHER): Payer: Medicaid Other

## 2015-03-31 DIAGNOSIS — Z862 Personal history of diseases of the blood and blood-forming organs and certain disorders involving the immune mechanism: Secondary | ICD-10-CM | POA: Diagnosis not present

## 2015-03-31 DIAGNOSIS — H9209 Otalgia, unspecified ear: Secondary | ICD-10-CM | POA: Diagnosis not present

## 2015-03-31 DIAGNOSIS — J029 Acute pharyngitis, unspecified: Secondary | ICD-10-CM | POA: Insufficient documentation

## 2015-03-31 DIAGNOSIS — J329 Chronic sinusitis, unspecified: Secondary | ICD-10-CM | POA: Insufficient documentation

## 2015-03-31 DIAGNOSIS — R0981 Nasal congestion: Secondary | ICD-10-CM | POA: Diagnosis present

## 2015-03-31 MED ORDER — FLUTICASONE PROPIONATE 50 MCG/ACT NA SUSP: 2.0000 | Freq: Every day | NASAL | Status: DC

## 2015-03-31 MED ORDER — GUAIFENESIN 100 MG/5ML PO LIQD: 100.0000 mg | ORAL | Status: DC | PRN

## 2015-03-31 NOTE — Discharge Instructions (Signed)

## 2015-03-31 NOTE — ED Provider Notes (Signed)
CSN: [id number]     Arrival date & time 03/31/15  0801 History   By signing my name below, I, Freida Busman, attest that this documentation has been prepared under the direction and in the presence of Glynn Octave, MD . Electronically Signed: Freida Busman, Scribe. 03/31/2015. 8:30 AM.  Chief Complaint  Patient presents with  . Nasal Congestion    The history is provided by the patient. No language interpreter was used.   HPI Comments:  Maureen Miller is a 29 y.o. female who presents to the Emergency Department complaining of productive cough with greenish sputum which began 3 days ago. She reports associated nasal congestion, sneezing, ear pain, HA with pain  and sore throat. Pt deneis fever, diarrhea, constipation, nausea, abdominal pain  and vomiting. Pt notes recent sick contacts; her son had a GI bug. No flu shot this season. She has been taking dayquil with minimal relief  Past Medical History  Diagnosis Date  . Anemia    Past Surgical History  Procedure Laterality Date  . Cesarean section     No family history on file. Social History  Substance Use Topics  . Smoking status: Never Smoker   . Smokeless tobacco: Never Used  . Alcohol Use: No   OB History    No data available     Review of Systems  Constitutional: Negative for fever and chills.  HENT: Positive for congestion, ear pain, sneezing and sore throat.   Respiratory: Positive for cough.   Neurological: Positive for headaches.    Allergies  Review of patient's allergies indicates no known allergies.  Home Medications   Prior to Admission medications   Medication Sig Start Date End Date Taking? Authorizing Provider  fluticasone (FLONASE) 50 MCG/ACT nasal spray Place 2 sprays into both nostrils daily. 03/31/15 04/03/15  Glynn Octave, MD  guaiFENesin (ROBITUSSIN) 100 MG/5ML liquid Take 5-10 mLs (100-200 mg total) by mouth every 4 (four) hours as needed for cough. 03/31/15   Glynn Octave, MD   BP 118/87  mmHg  Pulse 84  Temp(Src) 97.9 F (36.6 C) (Oral)  Resp 18  Ht  (1.676 m)  Wt 200 lb (90.719 kg)  BMI 32.30 kg/m2  SpO2 100%  LMP 03/25/2015 Physical Exam  Constitutional: She is oriented to person, place, and time. She appears well-developed and well-nourished. No distress.  HENT:  Head: Normocephalic and atraumatic.  Nose: Rhinorrhea present.  Mouth/Throat: Oropharynx is clear and moist. No oropharyngeal exudate.  Frontal and maxillary tenderness Rhinorrhea  Eyes: Conjunctivae and EOM are normal. Pupils are equal, round, and reactive to light.  Neck: Normal range of motion. Neck supple.  No meningismus.  Cardiovascular: Normal rate, regular rhythm, normal heart sounds and intact distal pulses.   No murmur heard. Pulmonary/Chest: Effort normal. No respiratory distress.  Decreased breath sounds right base   Abdominal: Soft. There is no tenderness. There is no rebound and no guarding.  Musculoskeletal: Normal range of motion. She exhibits no edema or tenderness.  Neurological: She is alert and oriented to person, place, and time. No cranial nerve deficit. She exhibits normal muscle tone. Coordination normal.  No ataxia on finger to nose bilaterally. No pronator drift. 5/5 strength throughout. CN 2-12 intact.Equal grip strength. Sensation intact.   Skin: Skin is warm.  Psychiatric: She has a normal mood and affect. Her behavior is normal.  Nursing note and vitals reviewed.   ED Course  Procedures   DIAGNOSTIC STUDIES:  Oxygen Saturation is 100% on RA, normal by  my interpretation.    COORDINATION OF CARE:  8:27 AM Will order CXR. Discussed treatment plan with pt at bedside and pt agreed to plan.  Labs Review Labs Reviewed - No data to display  Imaging Review Dg Chest 2 View  03/31/2015  CLINICAL DATA:  Productive cough for 3 days. EXAM: CHEST  2 VIEW COMPARISON:  09/03/2014 FINDINGS: Grossly unchanged cardiac silhouette and mediastinal contours. No focal airspace  opacities. There is minimal pleural parenchymal thickening about the bilateral major fissures. No evidence of edema. No pleural effusion or pneumothorax. No acute osseus abnormalities. IMPRESSION: No acute cardiopulmonary disease. Specifically, no evidence of pneumonia. Electronically Signed   By: Simonne ComeJohn  Watts M.D.   On: 03/31/2015 08:43   I have personally reviewed and evaluated these images as part of my medical decision-making.   EKG Interpretation None      MDM   Final diagnoses:  Sinusitis, unspecified chronicity, unspecified location   3 days of cough, congestion, sneezing, rhinorrhea and sore throat and earache. No fever. Sick contacts at home. No chest pain or shortness of breath.  Suspect viral URI/sinusitis. CXR obtained due to abnormal lung sounds.  CXR normal.  Supportive care for viral sinusitis.  Follow up with PCP. Return precautions discussed.  I personally performed the services described in this documentation, which was scribed in my presence. The recorded information has been reviewed and is accurate.   Glynn OctaveStephen Manoah Deckard, MD 03/31/15 [phone]

## 2015-03-31 NOTE — ED Notes (Signed)
Patient transported to X-ray 

## 2015-03-31 NOTE — ED Notes (Signed)
Patient here with 3 days of dry cough, congestion, runny nose, sore throat and earache. Taking Dayquil with minimal relief. No distress. Denies fever

## 2015-03-31 NOTE — ED Notes (Signed)
MD at bedside. 

## 2016-04-10 ENCOUNTER — Encounter (HOSPITAL_COMMUNITY): Payer: Self-pay | Admitting: Emergency Medicine

## 2016-04-10 ENCOUNTER — Emergency Department (HOSPITAL_COMMUNITY)
Admission: EM | Admit: 2016-04-10 | Discharge: 2016-04-10 | Disposition: A | Discharge status: Home / Self Care | Payer: Medicaid Other | Attending: Emergency Medicine | Admitting: Emergency Medicine

## 2016-04-10 DIAGNOSIS — H6692 Otitis media, unspecified, left ear: Secondary | ICD-10-CM | POA: Diagnosis not present

## 2016-04-10 DIAGNOSIS — H6690 Otitis media, unspecified, unspecified ear: Secondary | ICD-10-CM

## 2016-04-10 DIAGNOSIS — J111 Influenza due to unidentified influenza virus with other respiratory manifestations: Secondary | ICD-10-CM

## 2016-04-10 DIAGNOSIS — R0981 Nasal congestion: Secondary | ICD-10-CM | POA: Diagnosis present

## 2016-04-10 DIAGNOSIS — R69 Illness, unspecified: Secondary | ICD-10-CM

## 2016-04-10 MED ORDER — AMOXICILLIN 500 MG PO CAPS: 500.0000 mg | ORAL_CAPSULE | Freq: Three times a day (TID) | ORAL | 0 refills | Status: DC

## 2016-04-10 MED ORDER — OSELTAMIVIR PHOSPHATE 75 MG PO CAPS: 75.0000 mg | ORAL_CAPSULE | Freq: Two times a day (BID) | ORAL | 0 refills | Status: DC

## 2016-04-10 NOTE — ED Notes (Signed)
[facility] COUPON 229 GIVEN

## 2016-04-10 NOTE — ED Provider Notes (Signed)
[facility]PT Provider Note   CSN: [id number] Arrival date & time: 04/10/16  [id number]   By signing my name below, I, Maureen Miller, attest that this documentation has been prepared under the direction and in the presence of  Arthor CaptainAbigail Zymeir Salminen, PA-C. Electronically Signed: Clovis PuAvnee Miller, ED Scribe. 04/10/16. 9:37 AM.   History   Chief Complaint Chief Complaint  Patient presents with  . Nasal Congestion    The history is provided by the patient. No language interpreter was used.   HPI Comments:  Maureen Miller is a 30 y.o. female who presents to the Emergency Department complaining of sudden onset flu-like symptoms x yesterday. Pt notes associated fevers, chills, body aches, congestion, headaches, sore throat and left ear pain. Pt has taken Dayquil and Mucinex which she notes provides temporary relief. 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 No modifying factors noted. She is a non-smoker. Pt denies any other associated symptoms or complaints at this time.   Past Medical History:  Diagnosis Date  . Anemia     There are no active problems to display for this patient.   Past Surgical History:  Procedure Laterality Date  . CESAREAN SECTION      OB History    No data available       Home Medications    Prior to Admission medications   Medication Sig Start Date End Date Taking? Authorizing Provider  fluticasone (FLONASE) 50 MCG/ACT nasal spray Place 2 sprays into both nostrils daily. 03/31/15 04/03/15  Glynn OctaveStephen Rancour, MD  guaiFENesin (ROBITUSSIN) 100 MG/5ML liquid Take 5-10 mLs (100-200 mg total) by mouth every 4 (four) hours as needed for cough. 03/31/15   Glynn OctaveStephen Rancour, MD    Family History History reviewed. No pertinent family history.  Social History Social History  Substance Use Topics  . Smoking status: Never Smoker  . Smokeless tobacco: Never Used  . Alcohol use No     Allergies   Patient has no known allergies.   Review of  Systems Review of Systems  Constitutional: Positive for chills and fever.  HENT: Positive for congestion, ear pain and sore throat.   Musculoskeletal: Positive for myalgias.  Neurological: Positive for headaches.    Physical Exam Updated Vital Signs BP 124/84 (BP Location: Right Arm)   Pulse 105   Temp 98.1 F (36.7 C) (Oral)   Resp 21   Ht 5\' 7"  (1.702 m)   Wt 210 lb (95.3 kg)   LMP 04/01/2016   SpO2 100%   BMI 32.89 kg/m   Physical Exam  Constitutional: She is oriented to person, place, and time. She appears well-developed and well-nourished. No distress.  Appears ill  HENT:  Head: Normocephalic and atraumatic.  Right Ear: Tympanic membrane normal.  Left Ear: Tympanic membrane is erythematous and bulging.  Erythematous cobble stoning consistent with post nasal drip. Positive for nasal congestion.   Eyes: Conjunctivae are normal.  Neck: Normal range of motion. Neck supple.  Cardiovascular: Regular rhythm.   tachycardic  Pulmonary/Chest: Effort normal and breath sounds normal.  Abdominal: Soft. She exhibits no distension.  Lymphadenopathy:    She has cervical adenopathy.  Neurological: She is alert and oriented to person, place, and time.  Skin: Skin is warm and dry.  Psychiatric: She has a normal mood and affect.  Nursing note and vitals reviewed.    ED Treatments / Results  DIAGNOSTIC STUDIES:  Oxygen Saturation is 100% on RA, normal by my interpretation.    COORDINATION  OF CARE:  9:26 AM Discussed treatment plan with pt at bedside and pt agreed to plan.  Labs (all labs ordered are listed, but only abnormal results are displayed) Labs Reviewed - No data to display  EKG  EKG Interpretation None       Radiology No results found.  Procedures Procedures (including critical care time)  Medications Ordered in ED Medications - No data to display   Initial Impression / Assessment and Plan / ED Course  I have reviewed the triage vital signs and the  nursing notes.  Pertinent labs & imaging results that were available during my care of the patient were reviewed by me and considered in my medical decision making (see chart for details).  Clinical Course    Patient  with otalgia and exam consistent with acute otitis media. No concern for acute mastoiditis, meningitis.  No antibiotic use in the last month.  Patient discharged home with Amoxicillin. Patient with symptoms consistent with influenza.  Vitals are stable, low-grade fever.  No signs of dehydration, tolerating PO's.  Lungs are clear. Due to patient's presentation and physical exam a chest x-ray was not ordered bc likely diagnosis of flu.  Discussed the cost versus benefit of Tamiflu treatment with the patient.  The patient understands that symptoms are greater than the recommended 24-48 hour window of treatment.  Patient will be discharged with instructions to orally hydrate, rest, and use over-the-counter medications such as anti-inflammatories ibuprofen and Aleve for muscle aches and Tylenol for fever.  Patient will also be given a cough suppressant.    Final Clinical Impressions(s) / ED Diagnoses   Final diagnoses:  Influenza-like illness  Acute otitis media, unspecified otitis media type    New Prescriptions New Prescriptions   No medications on file  I personally performed the services described in this documentation, which was scribed in my presence. The recorded information has been reviewed and is accurate.      Arthor CaptainAbigail Teriann Livingood, PA-C 04/10/16 0945    Marily MemosJason Mesner, MD 04/11/16 [phone]

## 2016-04-10 NOTE — Discharge Instructions (Signed)
You have a flu like illness and otitis media. Please take the medication I have prescribed as directed. Read the attached handouts about reasons to seek medical and emergency care. Take over the counter medications to treat your fever and flu like symptoms.

## 2016-04-10 NOTE — ED Triage Notes (Signed)
Pt states she started having nasal congestion, body aches, headaches, hot/cold sweats yesterday. Concerned she has the flu

## 2016-09-05 ENCOUNTER — Encounter (HOSPITAL_BASED_OUTPATIENT_CLINIC_OR_DEPARTMENT_OTHER): Payer: Self-pay | Admitting: *Deleted

## 2016-09-05 ENCOUNTER — Emergency Department (HOSPITAL_BASED_OUTPATIENT_CLINIC_OR_DEPARTMENT_OTHER): Payer: Self-pay

## 2016-09-05 ENCOUNTER — Emergency Department (HOSPITAL_BASED_OUTPATIENT_CLINIC_OR_DEPARTMENT_OTHER)
Admission: EM | Admit: 2016-09-05 | Discharge: 2016-09-06 | Disposition: A | Discharge status: Home / Self Care | Payer: Self-pay | Attending: Emergency Medicine | Admitting: Emergency Medicine

## 2016-09-05 DIAGNOSIS — M7989 Other specified soft tissue disorders: Secondary | ICD-10-CM | POA: Insufficient documentation

## 2016-09-05 DIAGNOSIS — M79672 Pain in left foot: Secondary | ICD-10-CM | POA: Insufficient documentation

## 2016-09-05 DIAGNOSIS — W010XXA Fall on same level from slipping, tripping and stumbling without subsequent striking against object, initial encounter: Secondary | ICD-10-CM | POA: Insufficient documentation

## 2016-09-05 DIAGNOSIS — Y9289 Other specified places as the place of occurrence of the external cause: Secondary | ICD-10-CM | POA: Insufficient documentation

## 2016-09-05 DIAGNOSIS — Y999 Unspecified external cause status: Secondary | ICD-10-CM | POA: Insufficient documentation

## 2016-09-05 DIAGNOSIS — M25572 Pain in left ankle and joints of left foot: Secondary | ICD-10-CM

## 2016-09-05 DIAGNOSIS — Y9389 Activity, other specified: Secondary | ICD-10-CM | POA: Insufficient documentation

## 2016-09-05 NOTE — ED Triage Notes (Signed)
Pt c/o left ankle injury x 1 hr ago

## 2016-09-05 NOTE — ED Provider Notes (Signed)
[facility]   CSN: [id number] Arrival date & time: 09/05/16  2223  By signing my name below, I, Modena Jansky, attest that this documentation has been prepared under the direction and in the presence of non-physician practitioner, Glenford Bayley, PA-C. Electronically Signed: Modena Jansky, Scribe. 09/05/2016. 11:45 PM.  History   Chief Complaint Chief Complaint  Patient presents with  . Ankle Injury   The history is provided by the patient. No language interpreter was used.   HPI Comments: Maureen Miller is a 31 y.o. female  who presents to the Emergency Department complaining of constant moderate left ankle pain that started today. She states she was ambulating off her front porch when she tripped and inverted her left ankle. No head injury. Her pain is exacerbated by weight-bearing. She reports associated left ankle tingling and swelling. Denies any other complaints at this time.  Past Medical History:  Diagnosis Date  . Anemia     There are no active problems to display for this patient.   Past Surgical History:  Procedure Laterality Date  . CESAREAN SECTION      OB History    No data available       Home Medications    Prior to Admission medications   Medication Sig Start Date End Date Taking? Authorizing Provider  fluticasone (FLONASE) 50 MCG/ACT nasal spray Place 2 sprays into both nostrils daily. 03/31/15 04/03/15  Glynn Octave, MD    Family History History reviewed. No pertinent family history.  Social History Social History  Substance Use Topics  . Smoking status: Never Smoker  . Smokeless tobacco: Never Used  . Alcohol use No     Allergies   Patient has no known allergies.   Review of Systems Review of Systems  Constitutional: Negative for chills and fever.  Musculoskeletal: Positive for arthralgias (Left ankle), joint swelling (Left anke) and myalgias (Left ankle). Negative for back pain.  Skin: Negative for rash and  wound.  Psychiatric/Behavioral: The patient is not nervous/anxious.      Physical Exam Updated Vital Signs BP 137/86 (BP Location: Right Arm)   Pulse 77   Temp 97.7 F (36.5 C) (Oral)   Resp 16   Ht  (1.702 m)   Wt 190 lb (86.2 kg)   LMP 09/05/2016   SpO2 98%   BMI 29.76 kg/m   Physical Exam  Constitutional: She appears well-developed and well-nourished. No distress.  HENT:  Head: Normocephalic and atraumatic.  Mouth/Throat: Oropharynx is clear and moist. No oropharyngeal exudate.  Eyes: Conjunctivae are normal. Pupils are equal, round, and reactive to light. Right eye exhibits no discharge. Left eye exhibits no discharge. No scleral icterus.  Neck: Normal range of motion. Neck supple. No thyromegaly present.  Cardiovascular: Normal rate, regular rhythm, normal heart sounds and intact distal pulses.  Exam reveals no gallop and no friction rub.   No murmur heard. Pulmonary/Chest: Effort normal and breath sounds normal. No stridor. No respiratory distress. She has no wheezes. She has no rales.  Abdominal: Soft. Bowel sounds are normal. She exhibits no distension. There is no tenderness. There is no rebound and no guarding.  Musculoskeletal: She exhibits no edema.       Left ankle: She exhibits swelling. She exhibits normal range of motion and normal pulse. Tenderness. Lateral malleolus and head of 5th metatarsal tenderness found. No medial malleolus and no proximal fibula tenderness found. Achilles tendon normal.       Feet:  Lymphadenopathy:  She has no cervical adenopathy.  Neurological: She is alert. Coordination normal.  Skin: Skin is warm and dry. No rash noted. She is not diaphoretic. No pallor.  Psychiatric: She has a normal mood and affect.  Nursing note and vitals reviewed.    ED Treatments / Results  DIAGNOSTIC STUDIES: Oxygen Saturation is 98% on RA, normal by my interpretation.    COORDINATION OF CARE: 11:49 PM- Pt advised of plan for treatment and pt  agrees.  Labs (all labs ordered are listed, but only abnormal results are displayed) Labs Reviewed - No data to display  EKG  EKG Interpretation None       Radiology Dg Ankle Complete Left  Result Date: 09/05/2016 CLINICAL DATA:  Lateral left ankle pain after falling EXAM: LEFT ANKLE COMPLETE - 3+ VIEW COMPARISON:  None. FINDINGS: There is no evidence of fracture, dislocation, or joint effusion. There is no evidence of arthropathy or other focal bone abnormality. Mild soft tissue swelling. IMPRESSION: No fracture or dislocation of the left ankle. Electronically Signed   By: Deatra Robinson M.D.   On: 09/05/2016 22:48   Dg Foot Complete Left  Result Date: 09/06/2016 CLINICAL DATA:  Twisted left foot and ankle while walking. Pain and swelling over the lateral ankle and fifth metatarsal. EXAM: LEFT FOOT - COMPLETE 3+ VIEW COMPARISON:  None for the right foot. Ankle radiographs from 09/05/2016. FINDINGS: There is no evidence of fracture or dislocation. There is no evidence of arthropathy or other focal bone abnormality. Soft tissue swelling the lateral malleolus. Base of fifth metatarsal appears intact. IMPRESSION: Soft tissue swelling over the lateral malleolus. No acute osseous abnormality or dislocation. Electronically Signed   By: Tollie Eth M.D.   On: 09/06/2016 00:47    Procedures Procedures (including critical care time)  Medications Ordered in ED Medications  ibuprofen (ADVIL,MOTRIN) tablet 800 mg (not administered)     Initial Impression / Assessment and Plan / ED Course  I have reviewed the triage vital signs and the nursing notes.  Pertinent labs & imaging results that were available during my care of the patient were reviewed by me and considered in my medical decision making (see chart for details).     Patient X-Ray negative for obvious fracture or dislocation. Pain managed in ED.   Home Care: Rest and elevate the injured ankle, apply ice intermittently. Use crutches  without weight bearing until able to comfortable bear partial weight, then progress to full weight bearing as tolerated. ASO splint applied. See sports medicine prn. Return precautions discussed. Patient understands and agrees with plan. Patient vitals stable throughout ED course and discharged in satisfactory condition.  Final Clinical Impressions(s) / ED Diagnoses   Final diagnoses:  Acute left ankle pain  Left foot pain    New Prescriptions New Prescriptions   No medications on file   I personally performed the services described in this documentation, which was scribed in my presence. The recorded information has been reviewed and is accurate.     Emi Holes, PA-C 09/06/16 0102    April Palumbo, MD 09/06/16 [phone]

## 2016-09-06 ENCOUNTER — Emergency Department (HOSPITAL_BASED_OUTPATIENT_CLINIC_OR_DEPARTMENT_OTHER): Payer: Self-pay

## 2016-09-06 MED ORDER — IBUPROFEN 800 MG PO TABS
ORAL_TABLET | ORAL | Status: AC
  Filled 2016-09-06: qty 1

## 2016-09-06 MED ORDER — IBUPROFEN 800 MG PO TABS
800.0000 mg | ORAL_TABLET | Freq: Once | ORAL | Status: AC
  Administered 2016-09-06: 800 mg via ORAL

## 2016-09-06 NOTE — Discharge Instructions (Signed)
Wear splint at all times except when bathing to provide support your ankle. Ambulate with crutches carefully. Begin without any weightbearing and progress to partial weightbearing as tolerated. Use ice 3-4 times daily alternating 20 minutes on, 20 minutes off. Keep leg elevated whenever you are not walking on it. You can take Tylenol and/or ibuprofen as prescribed over-the-counter as needed for your pain. Please follow-up with Dr. Pearletha Forge, a sports medicine doctor, if your symptoms are not improving over the next 10-14 days. Please return to the emergency department if you develop any new or worsening symptoms.

## 2018-01-05 ENCOUNTER — Emergency Department (HOSPITAL_BASED_OUTPATIENT_CLINIC_OR_DEPARTMENT_OTHER)
Admission: EM | Admit: 2018-01-05 | Discharge: 2018-01-05 | Disposition: A | Discharge status: Home / Self Care | Payer: No Typology Code available for payment source | Attending: Emergency Medicine | Admitting: Emergency Medicine

## 2018-01-05 ENCOUNTER — Encounter (HOSPITAL_BASED_OUTPATIENT_CLINIC_OR_DEPARTMENT_OTHER): Payer: Self-pay | Admitting: *Deleted

## 2018-01-05 ENCOUNTER — Other Ambulatory Visit: Payer: Self-pay

## 2018-01-05 DIAGNOSIS — M542 Cervicalgia: Secondary | ICD-10-CM

## 2018-01-05 DIAGNOSIS — Z79899 Other long term (current) drug therapy: Secondary | ICD-10-CM | POA: Diagnosis not present

## 2018-01-05 DIAGNOSIS — Y92410 Unspecified street and highway as the place of occurrence of the external cause: Secondary | ICD-10-CM | POA: Diagnosis not present

## 2018-01-05 DIAGNOSIS — M545 Low back pain, unspecified: Secondary | ICD-10-CM

## 2018-01-05 DIAGNOSIS — Y998 Other external cause status: Secondary | ICD-10-CM | POA: Insufficient documentation

## 2018-01-05 MED ORDER — CYCLOBENZAPRINE HCL 5 MG PO TABS
5.0000 mg | ORAL_TABLET | Freq: Once | ORAL | Status: AC
  Administered 2018-01-05: 5 mg via ORAL
  Filled 2018-01-05: qty 1

## 2018-01-05 MED ORDER — IBUPROFEN 800 MG PO TABS
800.0000 mg | ORAL_TABLET | Freq: Once | ORAL | Status: AC
  Administered 2018-01-05: 800 mg via ORAL
  Filled 2018-01-05: qty 1

## 2018-01-05 MED ORDER — CYCLOBENZAPRINE HCL 10 MG PO TABS: 10.0000 mg | ORAL_TABLET | Freq: Two times a day (BID) | ORAL | 0 refills | Status: DC | PRN
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
## 2018-01-05 NOTE — Discharge Instructions (Addendum)
Your physical exam checks out today. I have no concerns that you have any broken bones.  I have prescribed you a muscle relaxer, Flexeril, for muscle spasms. You may also use Tylenol and/or Ibuprofen (or Naproxen) for pain relief. You may also use warm or cold compresses. Soreness should begin to feel better over the next couple of weeks. Follow-up with your PCP if you continue to have issues for more than 4-6 weeks.

## 2018-09-04 IMAGING — CR DG FOOT COMPLETE 3+V*L*
3 series · 3 of 3 positions shown · non-contrast
Comparison: None for the right foot. Ankle radiographs from
09/05/2016.

CLINICAL DATA: Twisted left foot and ankle while walking. Pain and
swelling over the lateral ankle and fifth metatarsal.

EXAM:
LEFT FOOT - COMPLETE 3+ VIEW

[t foot ap left]
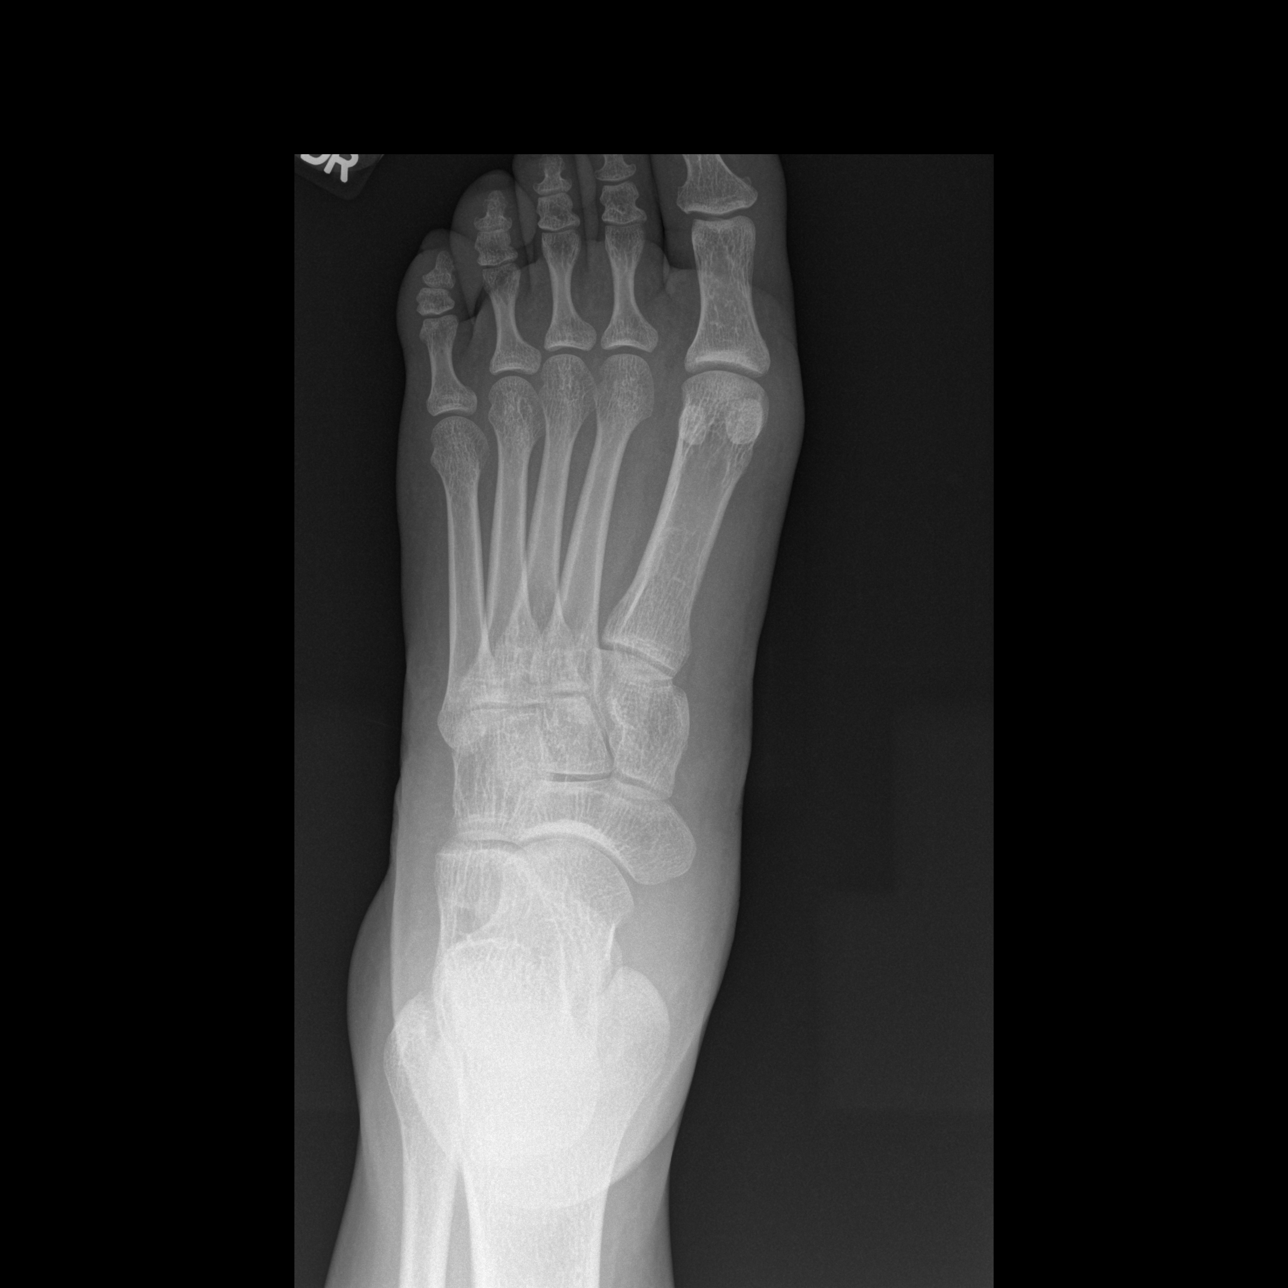

[t foot oblique left]
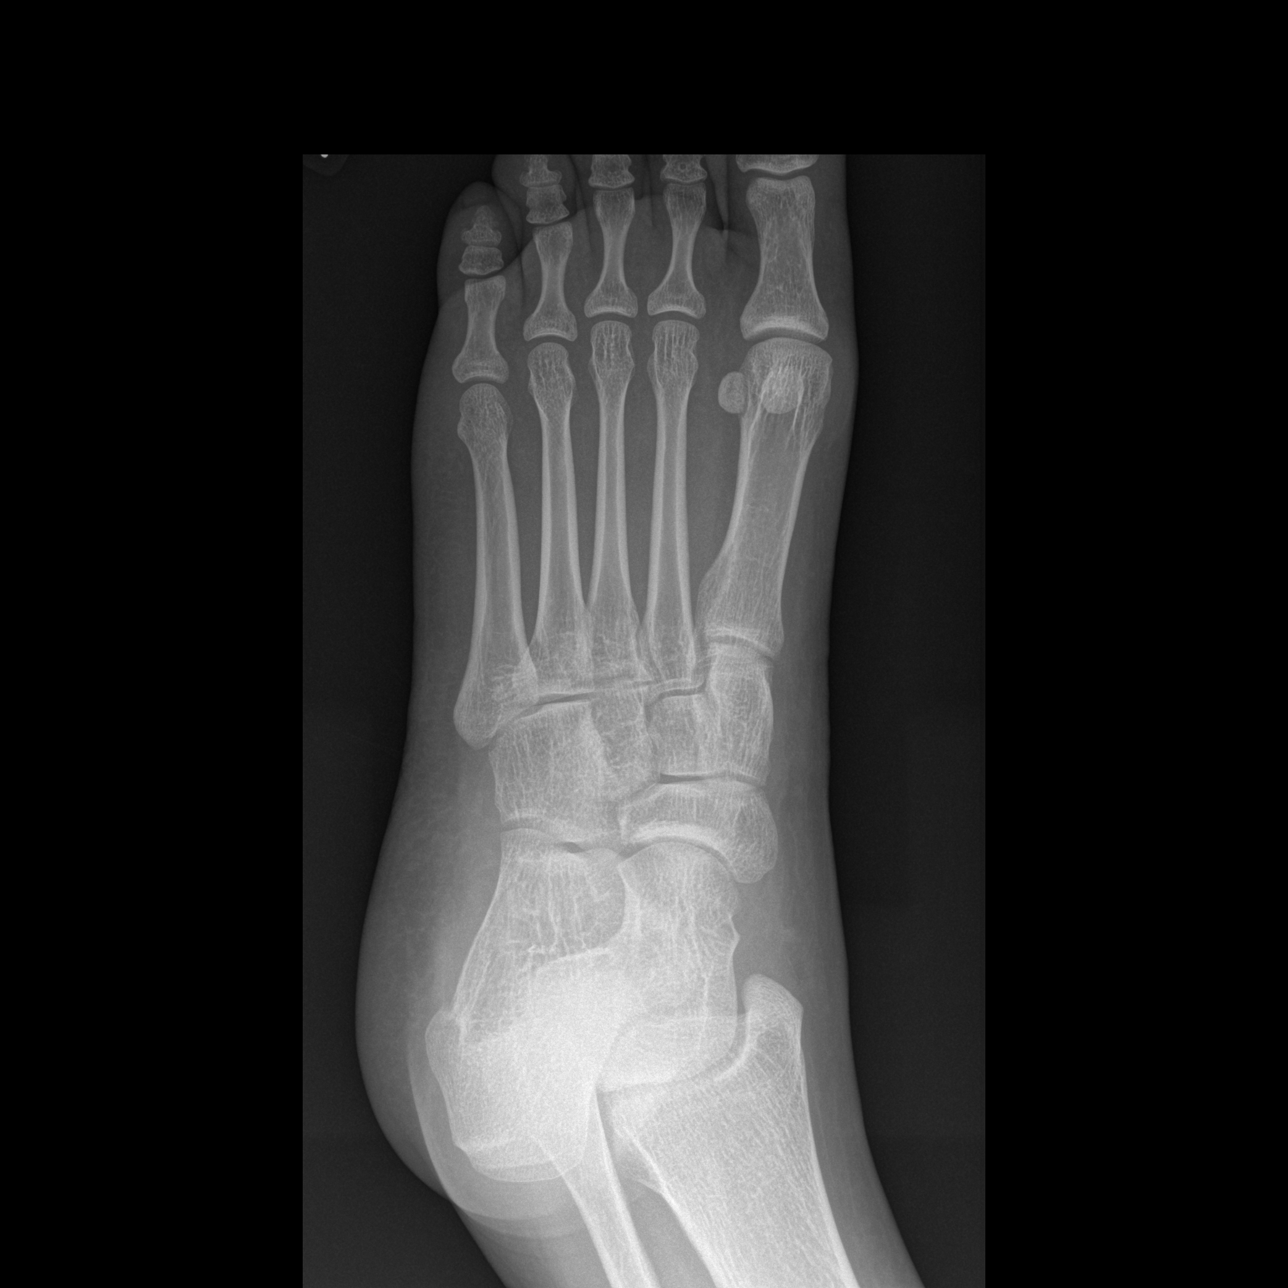

[t foot lat left]
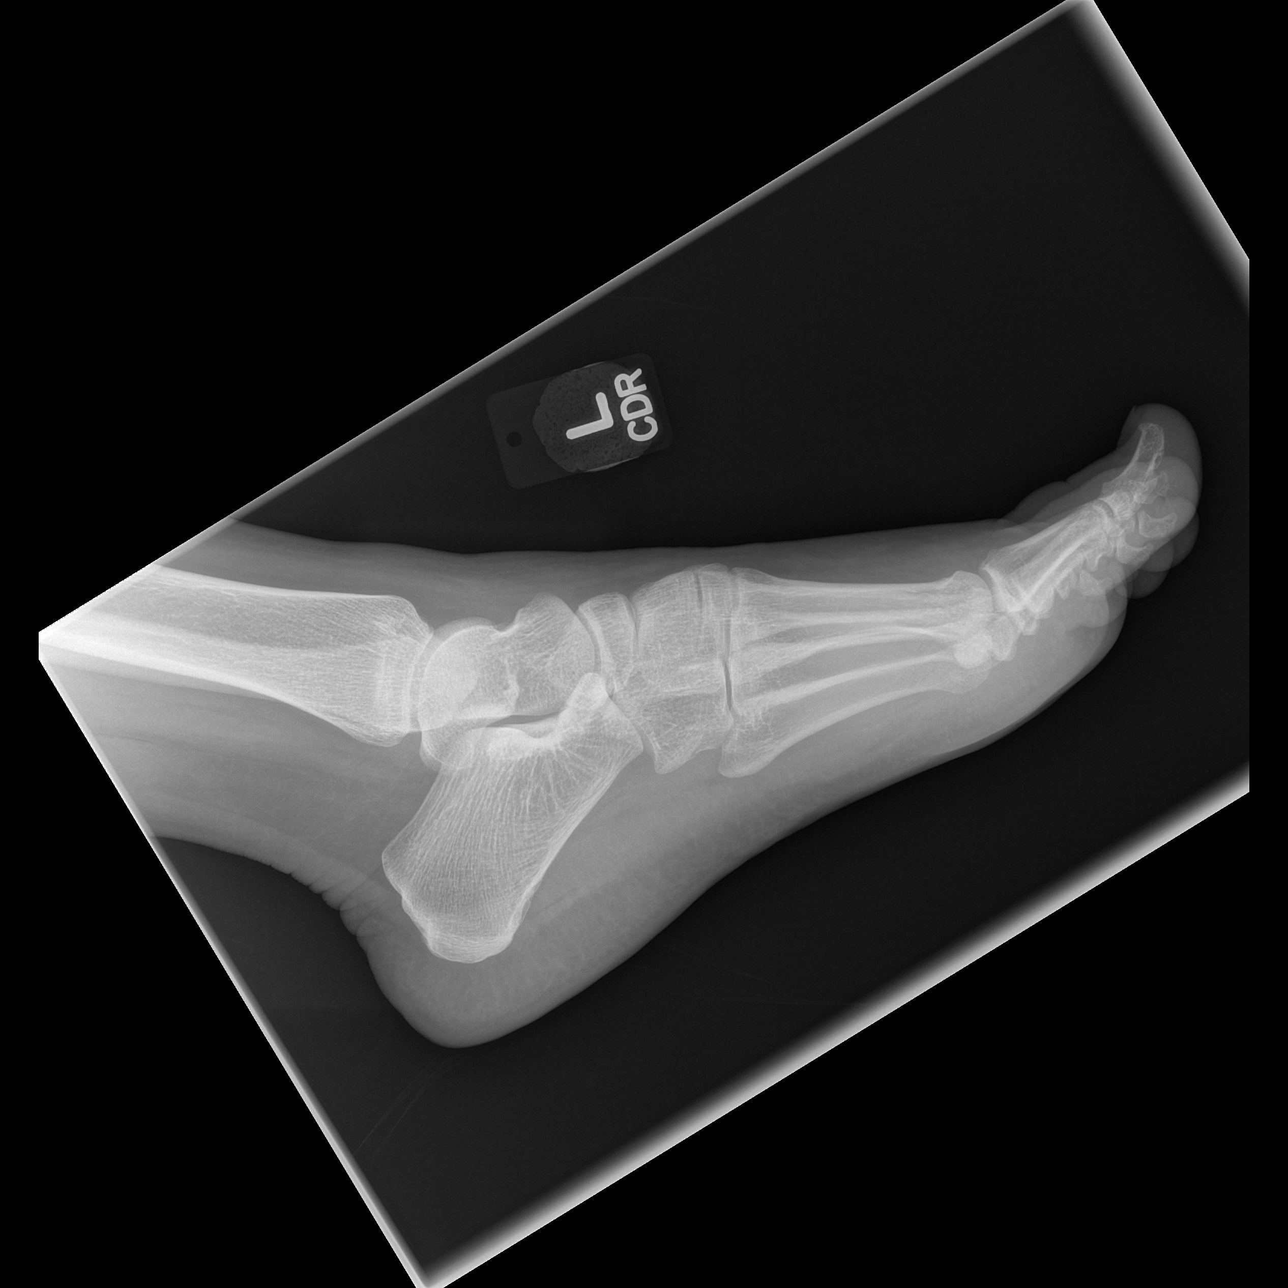

[3 of 3 positions shown; findings below may reference images not displayed]

FINDINGS: There is no evidence of fracture or dislocation. There is no
evidence of arthropathy or other focal bone abnormality. Soft tissue
swelling the lateral malleolus. Base of fifth metatarsal appears
intact.
IMPRESSION: Soft tissue swelling over the lateral malleolus. No acute osseous
abnormality or dislocation.

## 2018-09-15 ENCOUNTER — Other Ambulatory Visit: Payer: Self-pay

## 2018-09-15 ENCOUNTER — Emergency Department (HOSPITAL_COMMUNITY)
Admission: EM | Admit: 2018-09-15 | Discharge: 2018-09-15 | Disposition: A | Discharge status: Home / Self Care | Payer: Medicaid Other | Attending: Emergency Medicine | Admitting: Emergency Medicine

## 2018-09-15 ENCOUNTER — Encounter (HOSPITAL_COMMUNITY): Payer: Self-pay | Admitting: Emergency Medicine

## 2018-09-15 DIAGNOSIS — N390 Urinary tract infection, site not specified: Secondary | ICD-10-CM | POA: Diagnosis not present

## 2018-09-15 DIAGNOSIS — R109 Unspecified abdominal pain: Secondary | ICD-10-CM | POA: Diagnosis present

## 2018-09-15 DIAGNOSIS — M5489 Other dorsalgia: Secondary | ICD-10-CM | POA: Insufficient documentation

## 2018-09-15 LAB — URINALYSIS, ROUTINE W REFLEX MICROSCOPIC
Bacteria, UA: NONE SEEN
Bilirubin Urine: NEGATIVE
Glucose, UA: NEGATIVE mg/dL
Ketones, ur: NEGATIVE mg/dL
Nitrite: NEGATIVE
Protein, ur: 100 mg/dL — AB
RBC / HPF: >50 RBC/hpf — ABNORMAL HIGH (ref 0–5)
Specific Gravity, Urine: 1.025 (ref 1.005–1.030)
WBC, UA: >50 WBC/hpf — ABNORMAL HIGH (ref 0–5)
pH: 6 (ref 5.0–8.0)

## 2018-09-15 LAB — PREGNANCY, URINE: Preg Test, Ur: NEGATIVE

## 2018-09-15 MED ORDER — IBUPROFEN 600 MG PO TABS: 600.0000 mg | ORAL_TABLET | Freq: Four times a day (QID) | ORAL | 0 refills | Status: DC | PRN

## 2018-09-15 MED ORDER — CEPHALEXIN 250 MG PO CAPS
500.0000 mg | ORAL_CAPSULE | Freq: Once | ORAL | Status: AC
  Administered 2018-09-15: 500 mg via ORAL
  Filled 2018-09-15: qty 2

## 2018-09-15 MED ORDER — KETOROLAC TROMETHAMINE 30 MG/ML IJ SOLN
30.0000 mg | Freq: Once | INTRAMUSCULAR | Status: AC
  Administered 2018-09-15: 30 mg via INTRAMUSCULAR
  Filled 2018-09-15: qty 1

## 2018-09-15 MED ORDER — CEPHALEXIN 500 MG PO CAPS
500.0000 mg | ORAL_CAPSULE | Freq: Three times a day (TID) | ORAL | 0 refills | Status: DC
Start: 2018-09-15 — End: 2019-03-17

## 2018-09-15 MED ORDER — SODIUM CHLORIDE 0.9% FLUSH: 3.0000 mL | Freq: Once | INTRAVENOUS | Status: DC

## 2018-09-15 NOTE — ED Notes (Signed)
ED Provider at bedside. 

## 2018-09-15 NOTE — ED Notes (Signed)
Patient verbalizes understanding of discharge instructions. Opportunity for questioning and answers were provided. Armband removed by staff, pt discharged from ED home via POV.  

## 2018-09-15 NOTE — Discharge Instructions (Addendum)
You were seen today and found to have a urinary tract infection.  Take your medications as prescribed.  If you develop fever or worsening pain you should be reevaluated.

## 2018-09-15 NOTE — ED Provider Notes (Signed)
[facility]   CSN: [id number] Arrival date & time: 09/15/18  [id number]    History   Chief Complaint Chief Complaint  Patient presents with  . Abdominal Pain  . Back Pain    HPI Maureen Miller is a 33 y.o. female.     HPI  This is a 33 year old female who presents with abdominal and back pain.  Onset of symptoms 2 hours prior to arrival.  She reports acute onset of lower abdominal and bilateral back pain.  She describes the pain as sharp and nonradiating.  She rates her pain 8 out of 10.  Pain is similar to when she had a previous UTI.  She does report some dysuria and hematuria.  Denies any fevers.  Denies any vaginal discharge or concerns for STDs.  She has not taken anything for pain.  She denies any nausea, vomiting.  Past Medical History:  Diagnosis Date  . Anemia     There are no active problems to display for this patient.   Past Surgical History:  Procedure Laterality Date  . CESAREAN SECTION       OB History   No obstetric history on file.      Home Medications    Prior to Admission medications   Medication Sig Start Date End Date Taking? Authorizing Provider  cephALEXin (KEFLEX) 500 MG capsule Take 1 capsule (500 mg total) by mouth 3 (three) times daily. 09/15/18   Deuce Paternoster, Mayer Maskerourtney F, MD  cyclobenzaprine (FLEXERIL) 10 MG tablet Take 1 tablet (10 mg total) by mouth 2 (two) times daily as needed for muscle spasms. Patient not taking: Reported on 09/15/2018 01/05/18   Mortis, Jerrel IvoryGabrielle I, PA-C  fluticasone [facility](FLONASE) 50 MCG/ACT nasal spray Place 2 sprays into both nostrils daily. Patient not taking: Reported on 09/15/2018 03/31/15 09/15/27  Glynn Octaveancour, Stephen, MD  ibuprofen (ADVIL) 600 MG tablet Take 1 tablet (600 mg total) by mouth every 6 (six) hours as needed. 09/15/18   [name], Mayer Maskerourtney F, MD    Family History History reviewed. No pertinent family history.  Social History Social History   Tobacco Use  .  Smoking status: Never Smoker  . Smokeless tobacco: Never Used  Substance Use Topics  . Alcohol use: No  . Drug use: No     Allergies   Patient has no known allergies.   Review of Systems Review of Systems  Constitutional: Negative for fever.  Respiratory: Negative for shortness of breath.   Cardiovascular: Negative for chest pain.  Gastrointestinal: Positive for abdominal pain and nausea. Negative for diarrhea and vomiting.  Genitourinary: Positive for dysuria and hematuria.  Musculoskeletal: Positive for back pain.  Neurological: Negative for numbness.  All other systems reviewed and are negative.    Physical Exam Updated Vital Signs BP 115/71   Pulse 84   Temp 98.1 F (36.7 C) (Oral)   Resp 16   Ht 1.676 m (5\' 6" )   Wt 104.3 kg   LMP 08/23/2018 (Exact Date)   SpO2 99%   BMI 37.12 kg/m   Physical Exam Vitals signs and nursing note reviewed.  Constitutional:      Appearance: She is well-developed.  HENT:     Head: Normocephalic and atraumatic.  Neck:     Musculoskeletal: Neck supple.  Cardiovascular:     Rate and Rhythm: Normal rate and regular rhythm.  Pulmonary:     Effort: Pulmonary effort is normal. No respiratory distress.  Abdominal:     Palpations: Abdomen  is soft.     Tenderness: There is no abdominal tenderness. There is no guarding or rebound.     Comments: No CVA tenderness  Skin:    General: Skin is warm and dry.  Neurological:     Mental Status: She is alert and oriented to person, place, and time.      ED Treatments / Results  Labs (all labs ordered are listed, but only abnormal results are displayed) Labs Reviewed  URINALYSIS, ROUTINE W REFLEX MICROSCOPIC - Abnormal; Notable for the following components:      Result Value   APPearance TURBID (*)    Hgb urine dipstick LARGE (*)    Protein, ur 100 (*)    Leukocytes,Ua LARGE (*)    RBC / HPF >50 (*)    WBC, UA >50 (*)    Non Squamous Epithelial 0-5 (*)    All other components  within normal limits  URINE CULTURE  PREGNANCY, URINE    EKG None  Radiology No results found.  Procedures Procedures (including critical care time)  Medications Ordered in ED Medications  sodium chloride flush (NS) 0.9 % injection 3 mL (has no administration in time range)  ketorolac (TORADOL) 30 MG/ML injection 30 mg (30 mg Intramuscular Given 09/15/18 0353)  cephALEXin (KEFLEX) capsule 500 mg (500 mg Oral Given 09/15/18 0419)     Initial Impression / Assessment and Plan / ED Course  I have reviewed the triage vital signs and the nursing notes.  Pertinent labs & imaging results that were available during my care of the patient were reviewed by me and considered in my medical decision making (see chart for details).  Clinical Course as of Sep 15 422  Wed Sep 15, 2018  0414 On recheck, patient improved after Toradol.  She will be given a dose of Keflex.   [CH]    Clinical Course User Index [CH] Yareth Macdonnell, Mayer Masker, MD       Patient presents with lower abdominal pain and back pain with urinary symptoms.  She is overall nontoxic-appearing and vital signs are reassuring.  She is afebrile.  No CVA tenderness on exam.  Fairly reassuring abdominal exam with minimal tenderness to palpation.  Patient was given Toradol.  Urinalysis with large leukocyte esterase, greater than 50 red cells and greater than 50 white cells.  Suspect UTI.  Urine culture was sent.  Pain does not lateralize which makes kidney stones less likely.  Patient is much improved after Toradol.  Will send home with Keflex.  Given that she is not systemically ill appearing or febrile, doubt pyelonephritis.  After history, exam, and medical workup I feel the patient has been appropriately medically screened and is safe for discharge home. Pertinent diagnoses were discussed with the patient. Patient was given return precautions.   Final Clinical Impressions(s) / ED Diagnoses   Final diagnoses:  Lower urinary tract  infectious disease    ED Discharge Orders         Ordered    cephALEXin (KEFLEX) 500 MG capsule  3 times daily     09/15/18 0422    ibuprofen (ADVIL) 600 MG tablet  Every 6 hours PRN     09/15/18 0422           Shon Baton, MD 09/15/18 0425

## 2018-09-15 NOTE — ED Triage Notes (Signed)
Pt reports new onset lower abd pain & lower back pain. Reports some blood in urine & incr'd pain with urination. Denies cough, SOB, fevers, recent travel, or contact with sick individuals.

## 2018-09-17 LAB — URINE CULTURE: Culture: >=100000 — AB

## 2018-09-18 ENCOUNTER — Telehealth: Payer: Self-pay | Admitting: Emergency Medicine

## 2018-09-18 NOTE — Telephone Encounter (Signed)
Post ED Visit - Positive Culture Follow-up  Culture report reviewed by antimicrobial stewardship pharmacist: [facility] []  Enzo Bi, Pharm.D. []  Celedonio Miyamoto, Pharm.D., BCPS AQ-ID []  Garvin Fila, Pharm.D., BCPS []  Georgina Pillion, [address].D., BCPS []  [place], [place].D., BCPS, AAHIVP []  Estella Husk, Pharm.D., BCPS, AAHIVP []  Lysle Pearl, PharmD, BCPS [x]  Ewing Schlein, PharmD, BCPS []  Agapito Games, PharmD, BCPS []  Verlan Friends, PharmD []  Mervyn Gay, PharmD, BCPS []  Vinnie Level, PharmD  [facility] []  Len Childs, PharmD []  Greer Pickerel, PharmD []  Adalberto Cole, PharmD []  Perlie Gold, Rph []  Lonell Face) Jean Rosenthal, PharmD []  Earl Many, PharmD []  Junita Push, PharmD []  Dorna Leitz, PharmD []  Terrilee Files, PharmD []  Lynann Beaver, PharmD []  Keturah Barre, PharmD []  Loralee Pacas, PharmD []  Bernadene Person, PharmD   Positive urine culture Treated with cephalexin, organism sensitive to the same and no further patient follow-up is required at this time.  Carollee Herter Micah Galeno 09/18/2018, 11:55 AM

## 2018-09-25 ENCOUNTER — Other Ambulatory Visit: Payer: Self-pay

## 2018-09-25 ENCOUNTER — Encounter (HOSPITAL_COMMUNITY): Payer: Self-pay | Admitting: Emergency Medicine

## 2018-09-25 ENCOUNTER — Emergency Department (HOSPITAL_COMMUNITY)
Admission: EM | Admit: 2018-09-25 | Discharge: 2018-09-25 | Disposition: A | Discharge status: Home / Self Care | Payer: Medicaid Other | Attending: Emergency Medicine | Admitting: Emergency Medicine

## 2018-09-25 DIAGNOSIS — N898 Other specified noninflammatory disorders of vagina: Secondary | ICD-10-CM | POA: Diagnosis present

## 2018-09-25 DIAGNOSIS — Z79899 Other long term (current) drug therapy: Secondary | ICD-10-CM | POA: Insufficient documentation

## 2018-09-25 LAB — URINALYSIS, ROUTINE W REFLEX MICROSCOPIC
Bilirubin Urine: NEGATIVE
Glucose, UA: NEGATIVE mg/dL
Ketones, ur: NEGATIVE mg/dL
Nitrite: NEGATIVE
Protein, ur: NEGATIVE mg/dL
Specific Gravity, Urine: 1.003 — ABNORMAL LOW (ref 1.005–1.030)
pH: 6 (ref 5.0–8.0)

## 2018-09-25 LAB — WET PREP, GENITAL
Clue Cells Wet Prep HPF POC: NONE SEEN
Sperm: NONE SEEN
Trich, Wet Prep: NONE SEEN
Yeast Wet Prep HPF POC: NONE SEEN

## 2018-09-25 LAB — PREGNANCY, URINE: Preg Test, Ur: NEGATIVE

## 2018-09-25 MED ORDER — FLUCONAZOLE 150 MG PO TABS: 150.0000 mg | ORAL_TABLET | Freq: Once | ORAL | 0 refills | Status: AC

## 2018-09-25 NOTE — ED Provider Notes (Signed)
[facility] Provider Note   CSN: [id number] Arrival date & time: 09/25/18  0135    History   Chief Complaint Chief Complaint  Patient presents with  . Vaginal Discharge    HPI Maureen Miller is a 33 y.o. female.     Patient presents to the emergency department with a chief complaint of vaginal discharge and vaginal bleeding.  She states that she was diagnosed with a UTI last week and put on Keflex.  She reports having the discharge and bleeding, which she associates with the Keflex.  She states that it feels irritated "down there."  She denies any fever or chills.  Denies any abdominal pain.  Denies any other associated symptoms.  The history is provided by the patient. No language interpreter was used.    Past Medical History:  Diagnosis Date  . Anemia     There are no active problems to display for this patient.   Past Surgical History:  Procedure Laterality Date  . CESAREAN SECTION       OB History   No obstetric history on file.      Home Medications    Prior to Admission medications   Medication Sig Start Date End Date Taking? Authorizing Provider  cephALEXin (KEFLEX) 500 MG capsule Take 1 capsule (500 mg total) by mouth 3 (three) times daily. 09/15/18   Horton, Mayer Maskerourtney F, MD  cyclobenzaprine (FLEXERIL) 10 MG tablet Take 1 tablet (10 mg total) by mouth 2 (two) times daily as needed for muscle spasms. Patient not taking: Reported on 09/15/2018 01/05/18   Mortis, Jerrel IvoryGabrielle I, PA-C  fluticasone [facility](FLONASE) 50 MCG/ACT nasal spray Place 2 sprays into both nostrils daily. Patient not taking: Reported on 09/15/2018 03/31/15 09/15/27  Glynn Octaveancour, Stephen, MD  ibuprofen (ADVIL) 600 MG tablet Take 1 tablet (600 mg total) by mouth every 6 (six) hours as needed. 09/15/18   Horton, Mayer Maskerourtney F, MD    Family History No family history on file.  Social History Social History   Tobacco Use  . Smoking status: Never Smoker  . Smokeless  tobacco: Never Used  Substance Use Topics  . Alcohol use: No  . Drug use: No     Allergies   Patient has no known allergies.   Review of Systems Review of Systems  All other systems reviewed and are negative.    Physical Exam Updated Vital Signs BP 134/80 (BP Location: Right Arm)   Pulse 82   Temp 97.6 F (36.4 C) (Oral)   Resp 18   LMP 08/23/2018 (Approximate)   SpO2 98%   Physical Exam Vitals signs and nursing note reviewed.  Constitutional:      General: She is not in acute distress.    Appearance: She is well-developed.  HENT:     Head: Normocephalic and atraumatic.  Eyes:     Conjunctiva/sclera: Conjunctivae normal.  Neck:     Musculoskeletal: Neck supple.  Cardiovascular:     Rate and Rhythm: Normal rate and regular rhythm.     Heart sounds: No murmur.  Pulmonary:     Effort: Pulmonary effort is normal. No respiratory distress.     Breath sounds: Normal breath sounds.  Abdominal:     Palpations: Abdomen is soft.     Tenderness: There is no abdominal tenderness.  Genitourinary:    Comments: Some dark blood in vaginal vault, no visible trauma, no skin lesions  Mild suprapubic tenderness, no cervical motion tenderness Skin:    General: Skin  is warm and dry.  Neurological:     Mental Status: She is alert and oriented to person, place, and time.  Psychiatric:        Mood and Affect: Mood normal.        Behavior: Behavior normal.        Thought Content: Thought content normal.      ED Treatments / Results  Labs (all labs ordered are listed, but only abnormal results are displayed) Labs Reviewed  WET PREP, GENITAL  URINALYSIS, ROUTINE W REFLEX MICROSCOPIC  PREGNANCY, URINE  GC/CHLAMYDIA PROBE AMP ([place]) NOT AT [facility]    EKG None  Radiology No results found.  Procedures Procedures (including critical care time)  Medications Ordered in ED Medications - No data to display   Initial Impression / Assessment and Plan / ED Course  I  have reviewed the triage vital signs and the nursing notes.  Pertinent labs & imaging results that were available during my care of the patient were reviewed by me and considered in my medical decision making (see chart for details).        Patient complaining of vaginal discharge and irritation since starting Keflex for UTI.  Patient is on her menstrual cycle.  Pelvic exam largely unremarkable, but does show some blood in the vaginal vault.  She got some mild suprapubic tenderness, but no adnexal tenderness.  There is no evidence of skin breakdown or lesions on exam.  Question yeast infection given recent antibiotic use, will try Diflucan, recommend PCP/OB/GYN follow-up.  Final Clinical Impressions(s) / ED Diagnoses   Final diagnoses:  Vaginal irritation    ED Discharge Orders         Ordered    fluconazole (DIFLUCAN) 150 MG tablet   Once     09/25/18 0358           Roxy Horseman, PA-C 09/25/18 0359    Palumbo, April, MD 09/25/18 [phone]

## 2018-09-25 NOTE — ED Triage Notes (Signed)
Patient here with vaginal discharge.  She states she was here last week for UTI and was given prescription for Keflex and IM shot of rocephin.  She thought she had a yeast infection and tried a monostat OTC, now she has a discharge, some bleeding and itching and pain on her labial folds and vagina.

## 2018-09-27 LAB — GC/CHLAMYDIA PROBE AMP (~~LOC~~) NOT AT ARMC
Chlamydia: NEGATIVE
Neisseria Gonorrhea: NEGATIVE

## 2019-03-08 ENCOUNTER — Other Ambulatory Visit: Payer: Self-pay

## 2019-03-08 ENCOUNTER — Encounter (HOSPITAL_BASED_OUTPATIENT_CLINIC_OR_DEPARTMENT_OTHER): Payer: Self-pay | Admitting: *Deleted

## 2019-03-08 ENCOUNTER — Emergency Department (HOSPITAL_BASED_OUTPATIENT_CLINIC_OR_DEPARTMENT_OTHER)
Admission: EM | Admit: 2019-03-08 | Discharge: 2019-03-08 | Disposition: A | Discharge status: Home / Self Care | Payer: Medicaid Other | Attending: Emergency Medicine | Admitting: Emergency Medicine

## 2019-03-08 DIAGNOSIS — N3001 Acute cystitis with hematuria: Secondary | ICD-10-CM | POA: Insufficient documentation

## 2019-03-08 DIAGNOSIS — M545 Low back pain: Secondary | ICD-10-CM | POA: Diagnosis present

## 2019-03-08 LAB — PREGNANCY, URINE: Preg Test, Ur: NEGATIVE

## 2019-03-08 LAB — URINALYSIS, MICROSCOPIC (REFLEX)
RBC / HPF: >50 RBC/hpf (ref 0–5)
WBC, UA: >50 WBC/hpf (ref 0–5)

## 2019-03-08 LAB — URINALYSIS, ROUTINE W REFLEX MICROSCOPIC
Bilirubin Urine: NEGATIVE
Glucose, UA: NEGATIVE mg/dL
Ketones, ur: NEGATIVE mg/dL
Nitrite: POSITIVE — AB
Protein, ur: 100 mg/dL — AB
Specific Gravity, Urine: 1.02 (ref 1.005–1.030)
pH: 6 (ref 5.0–8.0)

## 2019-03-08 MED ORDER — KETOROLAC TROMETHAMINE 60 MG/2ML IM SOLN
60.0000 mg | Freq: Once | INTRAMUSCULAR | Status: AC
  Administered 2019-03-08: 10:00:00 60 mg via INTRAMUSCULAR
  Filled 2019-03-08: qty 2

## 2019-03-08 MED ORDER — NITROFURANTOIN MONOHYD MACRO 100 MG PO CAPS: 100.0000 mg | ORAL_CAPSULE | Freq: Two times a day (BID) | ORAL | 0 refills | Status: DC

## 2019-03-08 MED FILL — NITROFURANTOIN MONO-MCR 100: 100 | 5 days supply | Qty: 10 | Fill #0

## 2019-03-08 NOTE — ED Triage Notes (Signed)
Loser back and abd pain x 2 days, urinary freq, denies burning,  Denies vag dc,  Denies n/v/diarrhea  Denies inj

## 2019-03-08 NOTE — ED Provider Notes (Signed)
[facility] EMERGENCY DEPARTMENT Provider Note   CSN: [id number] Arrival date & time: 03/08/19  [id number]     History   Chief Complaint Chief Complaint  Patient presents with  . Back Pain    HPI Maureen Miller is a 33 y.o. female.     The history is provided by the patient.  Abdominal Pain Pain location:  Suprapubic Pain quality: aching, cramping and shooting   Pain radiates to:  Back Pain severity:  Severe Onset quality:  Gradual Duration:  2 days Timing:  Constant Progression:  Worsening Chronicity:  New Context: not medication withdrawal, not previous surgeries, not recent travel and not sick contacts   Context comment:  Started spontaneously Relieved by:  Nothing Worsened by:  Nothing Ineffective treatments:  None tried Associated symptoms: anorexia, dysuria and vaginal bleeding   Associated symptoms: no fever, no nausea, no shortness of breath, no vaginal discharge and no vomiting   Associated symptoms comment:  Frequency and urgency and urine has looked cloudy.  Currently on menses. Risk factors: not pregnant     Past Medical History:  Diagnosis Date  . Anemia   . Anemia     There are no active problems to display for this patient.   Past Surgical History:  Procedure Laterality Date  . CESAREAN SECTION       OB History   No obstetric history on file.      Home Medications    Prior to Admission medications   Medication Sig Start Date End Date Taking? Authorizing Provider  cephALEXin (KEFLEX) 500 MG capsule Take 1 capsule (500 mg total) by mouth 3 (three) times daily. 09/15/18   Horton, Mayer Maskerourtney F, MD  cyclobenzaprine (FLEXERIL) 10 MG tablet Take 1 tablet (10 mg total) by mouth 2 (two) times daily as needed for muscle spasms. Patient not taking: Reported on 09/15/2018 01/05/18   Mortis, Jerrel IvoryGabrielle I, PA-C  fluticasone [facility](FLONASE) 50 MCG/ACT nasal spray Place 2 sprays into both nostrils daily. Patient not taking: Reported on 09/15/2018 03/31/15  09/15/27  Glynn Octaveancour, Stephen, MD  ibuprofen (ADVIL) 600 MG tablet Take 1 tablet (600 mg total) by mouth every 6 (six) hours as needed. 09/15/18   Horton, Mayer Maskerourtney F, MD    Family History No family history on file.  Social History Social History   Tobacco Use  . Smoking status: Never Smoker  . Smokeless tobacco: Never Used  Substance Use Topics  . Alcohol use: No  . Drug use: No     Allergies   Patient has no known allergies.   Review of Systems Review of Systems  Constitutional: Negative for fever.  Respiratory: Negative for shortness of breath.   Gastrointestinal: Positive for abdominal pain and anorexia. Negative for nausea and vomiting.  Genitourinary: Positive for dysuria and vaginal bleeding. Negative for vaginal discharge.  All other systems reviewed and are negative.    Physical Exam Updated Vital Signs BP 130/80 (BP Location: Right Arm)   Pulse 97   Temp 98.1 F (36.7 C) (Oral)   Resp 18   Ht 5\' 6"  (1.676 m)   Wt 99.8 kg   LMP 03/08/2019 (Exact Date)   SpO2 100%   BMI 35.51 kg/m   Physical Exam Vitals signs and nursing note reviewed.  Constitutional:      General: She is not in acute distress.    Appearance: She is well-developed. She is obese.  HENT:     Head: Normocephalic and atraumatic.  Eyes:     Pupils: Pupils  are equal, round, and reactive to light.  Cardiovascular:     Rate and Rhythm: Normal rate and regular rhythm.     Heart sounds: Normal heart sounds. No murmur. No friction rub.  Pulmonary:     Effort: Pulmonary effort is normal.     Breath sounds: Normal breath sounds. No wheezing or rales.  Abdominal:     General: Bowel sounds are normal. There is no distension.     Palpations: Abdomen is soft.     Tenderness: There is abdominal tenderness in the suprapubic area. There is no right CVA tenderness, left CVA tenderness, guarding or rebound.  Musculoskeletal: Normal range of motion.     Lumbar back: She exhibits tenderness. She exhibits  normal range of motion and no bony tenderness.     Comments: No edema  Skin:    General: Skin is warm and dry.     Findings: No rash.  Neurological:     General: No focal deficit present.     Mental Status: She is alert and oriented to person, place, and time.     Cranial Nerves: No cranial nerve deficit.  Psychiatric:        Mood and Affect: Mood normal.        Behavior: Behavior normal.        Thought Content: Thought content normal.      ED Treatments / Results  Labs (all labs ordered are listed, but only abnormal results are displayed) Labs Reviewed  URINALYSIS, ROUTINE W REFLEX MICROSCOPIC - Abnormal; Notable for the following components:      Result Value   APPearance CLOUDY (*)    Hgb urine dipstick LARGE (*)    Protein, ur 100 (*)    Nitrite POSITIVE (*)    Leukocytes,Ua SMALL (*)    All other components within normal limits  URINALYSIS, MICROSCOPIC (REFLEX) - Abnormal; Notable for the following components:   Bacteria, UA MANY (*)    All other components within normal limits  PREGNANCY, URINE    EKG None  Radiology No results found.  Procedures Procedures (including critical care time)  Medications Ordered in ED Medications  ketorolac (TORADOL) injection 60 mg (has no administration in time range)     Initial Impression / Assessment and Plan / ED Course  I have reviewed the triage vital signs and the nursing notes.  Pertinent labs & imaging results that were available during my care of the patient were reviewed by me and considered in my medical decision making (see chart for details).       Patient is presenting with 2 days of worsening abdominal discomfort which radiates into her lower back, urinary frequency, urgency and cloudiness.  She denies any infectious symptoms such as fever, nausea, vomiting or diarrhea.  She is currently on her menses but denies any vaginal discharge.  She is well-appearing on exam with normal vital signs.  She has no  right or left lower quadrant pain concerning for appendicitis, diverticulitis or ruptured viscus.  10:21 AM UA consistent with UTI.  Urine pregnancy test is negative.  Patient is currently sexually active with one partner but uses protection every time and low suspicion for TOA, a STD or ovarian torsion.  Patient had a urine culture done approximately 4 months ago when she had a UTI and it was E. coli pan susceptible.  Will start patient on Macrobid.  Final Clinical Impressions(s) / ED Diagnoses   Final diagnoses:  Acute cystitis with hematuria  ED Discharge Orders         Ordered    nitrofurantoin, macrocrystal-monohydrate, (MACROBID) 100 MG capsule  2 times daily     03/08/19 1021           Gwyneth Sprout, MD 03/08/19 1022

## 2019-03-17 ENCOUNTER — Other Ambulatory Visit: Payer: Self-pay

## 2019-03-17 ENCOUNTER — Emergency Department (HOSPITAL_BASED_OUTPATIENT_CLINIC_OR_DEPARTMENT_OTHER)
Admission: EM | Admit: 2019-03-17 | Discharge: 2019-03-17 | Disposition: A | Discharge status: Home / Self Care | Payer: Medicaid Other | Attending: Emergency Medicine | Admitting: Emergency Medicine

## 2019-03-17 ENCOUNTER — Encounter (HOSPITAL_BASED_OUTPATIENT_CLINIC_OR_DEPARTMENT_OTHER): Payer: Self-pay | Admitting: *Deleted

## 2019-03-17 DIAGNOSIS — R319 Hematuria, unspecified: Secondary | ICD-10-CM | POA: Diagnosis not present

## 2019-03-17 DIAGNOSIS — N390 Urinary tract infection, site not specified: Secondary | ICD-10-CM

## 2019-03-17 DIAGNOSIS — R300 Dysuria: Secondary | ICD-10-CM | POA: Diagnosis present

## 2019-03-17 DIAGNOSIS — Z79899 Other long term (current) drug therapy: Secondary | ICD-10-CM | POA: Diagnosis not present

## 2019-03-17 LAB — PREGNANCY, URINE: Preg Test, Ur: NEGATIVE

## 2019-03-17 LAB — URINALYSIS, MICROSCOPIC (REFLEX): WBC, UA: >50 WBC/hpf (ref 0–5)

## 2019-03-17 LAB — URINALYSIS, ROUTINE W REFLEX MICROSCOPIC
Bilirubin Urine: NEGATIVE
Glucose, UA: NEGATIVE mg/dL
Ketones, ur: 15 mg/dL — AB
Nitrite: POSITIVE — AB
Protein, ur: 30 mg/dL — AB
Specific Gravity, Urine: 1.025 (ref 1.005–1.030)
pH: 6 (ref 5.0–8.0)

## 2019-03-17 MED ORDER — CEPHALEXIN 250 MG PO CAPS
500.0000 mg | ORAL_CAPSULE | Freq: Once | ORAL | Status: AC
  Administered 2019-03-17: 22:00:00 500 mg via ORAL
  Filled 2019-03-17: qty 2

## 2019-03-17 MED ORDER — CEPHALEXIN 500 MG PO CAPS: 500.0000 mg | ORAL_CAPSULE | Freq: Three times a day (TID) | ORAL | 0 refills | Status: DC

## 2019-03-17 MED ORDER — PHENAZOPYRIDINE HCL 200 MG PO TABS: 200.0000 mg | ORAL_TABLET | Freq: Three times a day (TID) | ORAL | 0 refills | Status: DC

## 2019-03-17 NOTE — ED Notes (Signed)
Pt also endorsing vaginal discharge and odor since previous visit

## 2019-03-17 NOTE — ED Triage Notes (Signed)
Pt c/o dysuria and freq x 2 days , seen here early this month for same, improved with ABX and symptoms returned

## 2019-03-20 LAB — URINE CULTURE: Culture: >=100000 — AB

## 2019-03-21 ENCOUNTER — Telehealth: Payer: Self-pay | Admitting: *Deleted

## 2019-03-21 NOTE — ED Provider Notes (Signed)
[facility] EMERGENCY DEPARTMENT Provider Note   CSN: [id number] Arrival date & time: 03/17/19  2103     History   Chief Complaint Chief Complaint  Patient presents with  . Dysuria    HPI Maureen Miller is a 33 y.o. female.     HPI   33 year old female with dysuria and urinary frequency.  Onset 2 days ago.  She states she had similar symptoms a couple weeks ago but they resolved with antibiotics.  Return of the same symptoms again in the past 2 days.  No fevers or chills.  Some mild lower abdominal pain.  Past Medical History:  Diagnosis Date  . Anemia   . Anemia     There are no active problems to display for this patient.   Past Surgical History:  Procedure Laterality Date  . CESAREAN SECTION       OB History   No obstetric history on file.      Home Medications    Prior to Admission medications   Medication Sig Start Date End Date Taking? Authorizing Provider  cephALEXin (KEFLEX) 500 MG capsule Take 1 capsule (500 mg total) by mouth 3 (three) times daily. 03/17/19   Raeford Razor, MD  cyclobenzaprine (FLEXERIL) 10 MG tablet Take 1 tablet (10 mg total) by mouth 2 (two) times daily as needed for muscle spasms. Patient not taking: Reported on 09/15/2018 01/05/18   Mortis, Jerrel Ivory I, PA-C  fluticasone [facility]) 50 MCG/ACT nasal spray Place 2 sprays into both nostrils daily. Patient not taking: Reported on 09/15/2018 03/31/15 09/15/27  Glynn Octave, MD  ibuprofen (ADVIL) 600 MG tablet Take 1 tablet (600 mg total) by mouth every 6 (six) hours as needed. 09/15/18   Horton, Mayer Masker, MD  nitrofurantoin, macrocrystal-monohydrate, (MACROBID) 100 MG capsule Take 1 capsule (100 mg total) by mouth 2 (two) times daily. 03/08/19   Gwyneth Sprout, MD  phenazopyridine (PYRIDIUM) 200 MG tablet Take 1 tablet (200 mg total) by mouth 3 (three) times daily. 03/17/19   Raeford Razor, MD    Family History History reviewed. No pertinent family history.  Social  History Social History   Tobacco Use  . Smoking status: Never Smoker  . Smokeless tobacco: Never Used  Substance Use Topics  . Alcohol use: No  . Drug use: No     Allergies   Patient has no known allergies.   Review of Systems Review of Systems  All systems reviewed and negative, other than as noted in HPI.  Physical Exam Updated Vital Signs BP 135/82   Pulse 77   Temp 98.1 F (36.7 C) (Oral)   Resp 16   Ht 5\' 6"  (1.676 m)   Wt 99 kg   LMP 03/08/2019 (Exact Date)   SpO2 100%   BMI 35.23 kg/m   Physical Exam Vitals signs and nursing note reviewed.  Constitutional:      General: She is not in acute distress.    Appearance: She is well-developed. She is obese.  HENT:     Head: Normocephalic and atraumatic.  Eyes:     General:        Right eye: No discharge.        Left eye: No discharge.     Conjunctiva/sclera: Conjunctivae normal.  Neck:     Musculoskeletal: Neck supple.  Cardiovascular:     Rate and Rhythm: Normal rate and regular rhythm.     Heart sounds: Normal heart sounds. No murmur. No friction rub. No gallop.   Pulmonary:  Effort: Pulmonary effort is normal. No respiratory distress.     Breath sounds: Normal breath sounds.  Abdominal:     General: There is no distension.     Palpations: Abdomen is soft.     Tenderness: There is no abdominal tenderness.     Comments: Very mild suprapubic tenderness without rebound or guarding.  No distention.  Musculoskeletal:        General: No tenderness.  Skin:    General: Skin is warm and dry.  Neurological:     Mental Status: She is alert.  Psychiatric:        Behavior: Behavior normal.        Thought Content: Thought content normal.      ED Treatments / Results  Labs (all labs ordered are listed, but only abnormal results are displayed) Labs Reviewed  URINE CULTURE - Abnormal; Notable for the following components:      Result Value   Culture >=100,000 COLONIES/mL ESCHERICHIA COLI (*)     Organism ID, Bacteria ESCHERICHIA COLI (*)    All other components within normal limits  URINALYSIS, ROUTINE W REFLEX MICROSCOPIC - Abnormal; Notable for the following components:   APPearance CLOUDY (*)    Hgb urine dipstick SMALL (*)    Ketones, ur 15 (*)    Protein, ur 30 (*)    Nitrite POSITIVE (*)    Leukocytes,Ua MODERATE (*)    All other components within normal limits  URINALYSIS, MICROSCOPIC (REFLEX) - Abnormal; Notable for the following components:   Bacteria, UA MANY (*)    All other components within normal limits  PREGNANCY, URINE    EKG None  Radiology No results found.  Procedures Procedures (including critical care time)  Medications Ordered in ED Medications  cephALEXin (KEFLEX) capsule 500 mg (500 mg Oral Given 03/17/19 2159)     Initial Impression / Assessment and Plan / ED Course  I have reviewed the triage vital signs and the nursing notes.  Pertinent labs & imaging results that were available during my care of the patient were reviewed by me and considered in my medical decision making (see chart for details).       33 year old female with symptoms and UA consistent with UTI.  I feel she is appropriate for outpatient treatment.  Return precautions discussed.  Urine culture sent.   Final Clinical Impressions(s) / ED Diagnoses   Final diagnoses:  Urinary tract infection with hematuria, site unspecified    ED Discharge Orders         Ordered    phenazopyridine (PYRIDIUM) 200 MG tablet  3 times daily     03/17/19 2154    cephALEXin (KEFLEX) 500 MG capsule  3 times daily     03/17/19 2154           Virgel Manifold, MD 03/21/19 1132

## 2019-03-21 NOTE — Telephone Encounter (Signed)
Post ED Visit - Positive Culture Follow-up  Culture report reviewed by antimicrobial stewardship pharmacist: [place] Team []  Elenor Quinones, Pharm.D. []  Heide Guile, Pharm.D., BCPS AQ-ID []  Parks Neptune, Pharm.D., BCPS []  Alycia Rossetti, Pharm.D., BCPS []  [place], [place].D., BCPS, AAHIVP []  Legrand Como, Pharm.D., BCPS, AAHIVP [x]  Salome Arnt, PharmD, BCPS []  Johnnette Gourd, PharmD, BCPS []  Hughes Better, PharmD, BCPS []  Leeroy Cha, PharmD []  Laqueta Linden, PharmD, BCPS []  Albertina Parr, PharmD  [place] Team []  Leodis Sias, PharmD []  Lindell Spar, PharmD []  Royetta Asal, PharmD []  Graylin Shiver, Rph []  Rema Fendt) Glennon Mac, PharmD []  Arlyn Dunning, PharmD []  Netta Cedars, PharmD []  Dia Sitter, PharmD []  Leone Haven, PharmD []  Gretta Arab, PharmD []  Theodis Shove, PharmD []  Peggyann Juba, PharmD []  Reuel Boom, PharmD   Positive urine culture Treated with Cephalexin, organism sensitive to the same and no further patient follow-up is required at this time.  Harlon Flor [facility] 03/21/2019, 10:59 AM

## 2019-05-27 ENCOUNTER — Encounter (HOSPITAL_BASED_OUTPATIENT_CLINIC_OR_DEPARTMENT_OTHER): Payer: Self-pay

## 2019-05-27 ENCOUNTER — Emergency Department (INDEPENDENT_AMBULATORY_CARE_PROVIDER_SITE_OTHER)
Admission: EM | Admit: 2019-05-27 | Discharge: 2019-05-27 | Disposition: A | Discharge status: Home / Self Care | Payer: Medicaid Other | Source: Home / Self Care | Attending: Family Medicine | Admitting: Family Medicine

## 2019-05-27 ENCOUNTER — Other Ambulatory Visit: Payer: Self-pay

## 2019-05-27 ENCOUNTER — Encounter: Payer: Self-pay | Admitting: Emergency Medicine

## 2019-05-27 ENCOUNTER — Other Ambulatory Visit (HOSPITAL_COMMUNITY)
Admission: RE | Admit: 2019-05-27 | Discharge: 2019-05-27 | Disposition: A | Discharge status: Home / Self Care | Payer: Medicaid Other | Source: Ambulatory Visit | Attending: Family Medicine | Admitting: Family Medicine

## 2019-05-27 ENCOUNTER — Emergency Department (HOSPITAL_BASED_OUTPATIENT_CLINIC_OR_DEPARTMENT_OTHER)
Admission: EM | Admit: 2019-05-27 | Discharge: 2019-05-27 | Disposition: A | Discharge status: Home / Self Care | Payer: Medicaid Other | Attending: Emergency Medicine | Admitting: Emergency Medicine

## 2019-05-27 DIAGNOSIS — N898 Other specified noninflammatory disorders of vagina: Secondary | ICD-10-CM | POA: Diagnosis not present

## 2019-05-27 DIAGNOSIS — N899 Noninflammatory disorder of vagina, unspecified: Secondary | ICD-10-CM | POA: Diagnosis not present

## 2019-05-27 DIAGNOSIS — Z5321 Procedure and treatment not carried out due to patient leaving prior to being seen by health care provider: Secondary | ICD-10-CM | POA: Insufficient documentation

## 2019-05-27 LAB — URINALYSIS, ROUTINE W REFLEX MICROSCOPIC
Bilirubin Urine: NEGATIVE
Glucose, UA: NEGATIVE mg/dL
Hgb urine dipstick: NEGATIVE
Ketones, ur: NEGATIVE mg/dL
Leukocytes,Ua: NEGATIVE
Nitrite: NEGATIVE
Protein, ur: NEGATIVE mg/dL
Specific Gravity, Urine: >1.03 — ABNORMAL HIGH (ref 1.005–1.030)
pH: 6 (ref 5.0–8.0)

## 2019-05-27 LAB — POCT URINALYSIS DIP (MANUAL ENTRY)
Bilirubin, UA: NEGATIVE
Blood, UA: NEGATIVE
Glucose, UA: NEGATIVE mg/dL
Ketones, POC UA: NEGATIVE mg/dL
Leukocytes, UA: NEGATIVE
Nitrite, UA: NEGATIVE
Protein Ur, POC: NEGATIVE mg/dL
Spec Grav, UA: 1.025 (ref 1.010–1.025)
Urobilinogen, UA: 0.2 E.U./dL
pH, UA: 7 (ref 5.0–8.0)

## 2019-05-27 LAB — PREGNANCY, URINE: Preg Test, Ur: NEGATIVE

## 2019-05-27 NOTE — Discharge Instructions (Addendum)
Avoid sexual contact until after results are available and treatments completed

## 2019-05-27 NOTE — ED Triage Notes (Signed)
Pt came in this evening after leaving [facility] ER with c/o pelvic pain x2 weeks and clear vag d/c with slight odor. Pain worsens after sex. LMP currently on menses.   Reports sx's unchanged. ua neg

## 2019-05-27 NOTE — ED Provider Notes (Signed)
[facility]    CSN: [id number] Arrival date & time: 05/27/19  1901      History   Chief Complaint Chief Complaint  Patient presents with  . Vaginal Discharge  . Pelvic Pain    HPI Maureen Miller is a 34 y.o. female.   Patient complains of 1.5 week history of clear mucoid vaginal discharge, slightly malodorous, and vague pelvic pain.  Pain is worse after intercourse. Patient's last menstrual period was 05/23/2019, and now completed.  She presented to [facility] ED today but decided to leave because of wait time.  A urinalysis there was unremarkable, and urine pregnancy test negative.  She reports that she had negative RPR and HIV tests in July 2020.  The history is provided by the patient.  Vaginal Discharge Quality:  Mucoid and clear Severity:  Mild Onset quality:  Gradual Duration:  10 days Timing:  Constant Progression:  Unchanged Chronicity:  New Context: spontaneously   Relieved by:  None tried Worsened by:  Nothing Ineffective treatments:  None tried Associated symptoms: dyspareunia   Associated symptoms: no abdominal pain, no dysuria, no fever, no genital lesions, no nausea, no rash, no urinary frequency, no urinary hesitancy, no urinary incontinence, no vaginal itching and no vomiting     Past Medical History:  Diagnosis Date  . Anemia   . Anemia     There are no problems to display for this patient.   Past Surgical History:  Procedure Laterality Date  . CESAREAN SECTION      OB History   No obstetric history on file.      Home Medications    Prior to Admission medications   Medication Sig Start Date End Date Taking? Authorizing Provider  cephALEXin (KEFLEX) 500 MG capsule Take 1 capsule (500 mg total) by mouth 3 (three) times daily. 03/17/19   Raeford Razor, MD  cyclobenzaprine (FLEXERIL) 10 MG tablet Take 1 tablet (10 mg total) by mouth 2 (two) times daily as needed for muscle spasms. Patient not taking: Reported on  09/15/2018 01/05/18   Mortis, Jerrel Ivory I, PA-C  fluticasone [facility]) 50 MCG/ACT nasal spray Place 2 sprays into both nostrils daily. Patient not taking: Reported on 09/15/2018 03/31/15 09/15/27  Glynn Octave, MD  ibuprofen (ADVIL) 600 MG tablet Take 1 tablet (600 mg total) by mouth every 6 (six) hours as needed. 09/15/18   Horton, Mayer Masker, MD  nitrofurantoin, macrocrystal-monohydrate, (MACROBID) 100 MG capsule Take 1 capsule (100 mg total) by mouth 2 (two) times daily. 03/08/19   Gwyneth Sprout, MD  phenazopyridine (PYRIDIUM) 200 MG tablet Take 1 tablet (200 mg total) by mouth 3 (three) times daily. 03/17/19   Raeford Razor, MD    Family History History reviewed. No pertinent family history.  Social History Social History   Tobacco Use  . Smoking status: Never Smoker  . Smokeless tobacco: Never Used  Substance Use Topics  . Alcohol use: No  . Drug use: No     Allergies   Patient has no known allergies.   Review of Systems Review of Systems  Constitutional: Negative for chills, diaphoresis, fatigue and fever.  Gastrointestinal: Negative for abdominal pain, diarrhea, nausea and vomiting.  Genitourinary: Positive for dyspareunia, pelvic pain and vaginal discharge. Negative for bladder incontinence, dysuria, flank pain, frequency, genital sores, hematuria, hesitancy, urgency, vaginal bleeding and vaginal pain.  Skin: Negative.   All other systems reviewed and are negative.    Physical Exam Triage Vital Signs ED Triage Vitals [05/27/19 1926]  Enc Vitals Group     BP 121/81     Pulse Rate 74     Resp      Temp (!) 97.5 F (36.4 C)     Temp Source Oral     SpO2 99 %     Weight 229 lb 12.8 oz (104.2 kg)     Height 5\' 6"  (1.676 m)     Head Circumference      Peak Flow      Pain Score 6     Pain Loc      Pain Edu?      Excl. in [place]?    No data found.  Updated Vital Signs BP 121/81 (BP Location: Left Arm)   Pulse 74   Temp (!) 97.5 F (36.4 C) (Oral)   Ht 5'  6" (1.676 m)   Wt 104.2 kg   LMP 05/23/2019   SpO2 99%   BMI 37.09 kg/m   Visual Acuity Right Eye Distance:   Left Eye Distance:   Bilateral Distance:    Right Eye Near:   Left Eye Near:    Bilateral Near:     Physical Exam Vitals and nursing note reviewed. Exam conducted with a chaperone present.  Constitutional:      General: She is not in acute distress.    Appearance: She is obese. She is not ill-appearing.  HENT:     Head: Normocephalic.     Mouth/Throat:     Pharynx: Oropharynx is clear.  Eyes:     Pupils: Pupils are equal, round, and reactive to light.  Cardiovascular:     Rate and Rhythm: Normal rate.     Heart sounds: Normal heart sounds.  Pulmonary:     Breath sounds: Normal breath sounds.  Abdominal:     Palpations: Abdomen is soft.     Tenderness: There is no abdominal tenderness.     Hernia: There is no hernia in the left inguinal area or right inguinal area.  Genitourinary:    General: Normal vulva.     Exam position: Lithotomy position.     Pubic Area: No rash.      Labia:        Right: No rash, tenderness, lesion or injury.        Left: No rash, tenderness, lesion or injury.      Urethra: No prolapse, urethral swelling or urethral lesion.     Vagina: Normal.     Cervix: No cervical motion tenderness, discharge, friability, lesion, erythema or cervical bleeding.     Comments: Vaginal vault has clear mucoid discharge present.  No discharge present from cervical os.  No CMT resulting from speculum. Bimanual exam deferred. Musculoskeletal:     Cervical back: Neck supple.     Right lower leg: No edema.     Left lower leg: No edema.  Lymphadenopathy:     Cervical: No cervical adenopathy.     Lower Body: No right inguinal adenopathy. No left inguinal adenopathy.  Skin:    General: Skin is warm and dry.  Neurological:     Mental Status: She is alert.      UC Treatments / Results  Labs (all labs ordered are listed, but only abnormal results are  displayed) Labs Reviewed  POCT URINALYSIS DIP (MANUAL ENTRY) - Abnormal; Notable for the following components:      Result Value   Clarity, UA cloudy (*)    All other components within normal limits  CERVICOVAGINAL ANCILLARY ONLY  EKG   Radiology No results found.  Procedures Procedures (including critical care time)  Medications Ordered in UC Medications - No data to display  Initial Impression / Assessment and Plan / UC Course  I have reviewed the triage vital signs and the nursing notes.  Pertinent labs & imaging results that were available during my care of the patient were reviewed by me and considered in my medical decision making (see chart for details).    Suspect BV. Cervicovaginal ancillary pending.  Treatment to be determined by results.   Final Clinical Impressions(s) / UC Diagnoses   Final diagnoses:  Vaginal discharge     Discharge Instructions     Avoid sexual contact until after results are available and treatments completed    ED Prescriptions    None        Lattie Haw, MD 05/30/19 [id number]

## 2019-05-27 NOTE — ED Triage Notes (Signed)
C/o vaginal d/c x 2-3 weeks-NAD-steady gait

## 2019-06-01 ENCOUNTER — Telehealth (HOSPITAL_COMMUNITY): Payer: Self-pay | Admitting: Emergency Medicine

## 2019-06-01 LAB — CERVICOVAGINAL ANCILLARY ONLY
Bacterial vaginitis: POSITIVE — AB
Candida vaginitis: NEGATIVE
Chlamydia: NEGATIVE
Neisseria Gonorrhea: NEGATIVE
Trichomonas: NEGATIVE

## 2019-06-01 MED ORDER — METRONIDAZOLE 500 MG PO TABS: 500.0000 mg | ORAL_TABLET | Freq: Two times a day (BID) | ORAL | 0 refills | Status: AC

## 2019-06-01 NOTE — Telephone Encounter (Signed)
Bacterial vaginosis is positive. Pt needs treatment. Flagyl 500 mg BID x 7 days #14 no refills sent to patients pharmacy of choice.    Patient contacted by phone and made aware of    results. Pt verbalized understanding and had all questions answered.    

## 2019-06-07 ENCOUNTER — Ambulatory Visit: Payer: Medicaid Other | Attending: Internal Medicine

## 2019-06-07 DIAGNOSIS — Z20822 Contact with and (suspected) exposure to covid-19: Secondary | ICD-10-CM

## 2019-06-09 LAB — NOVEL CORONAVIRUS, NAA: SARS-CoV-2, NAA: NOT DETECTED

## 2019-12-07 ENCOUNTER — Encounter (HOSPITAL_BASED_OUTPATIENT_CLINIC_OR_DEPARTMENT_OTHER): Payer: Self-pay

## 2019-12-07 ENCOUNTER — Other Ambulatory Visit: Payer: Self-pay

## 2019-12-07 ENCOUNTER — Emergency Department (HOSPITAL_BASED_OUTPATIENT_CLINIC_OR_DEPARTMENT_OTHER)
Admission: EM | Admit: 2019-12-07 | Discharge: 2019-12-07 | Disposition: A | Discharge status: Home / Self Care | Payer: Medicaid Other | Attending: Emergency Medicine | Admitting: Emergency Medicine

## 2019-12-07 ENCOUNTER — Emergency Department (HOSPITAL_BASED_OUTPATIENT_CLINIC_OR_DEPARTMENT_OTHER): Payer: Medicaid Other

## 2019-12-07 DIAGNOSIS — R42 Dizziness and giddiness: Secondary | ICD-10-CM | POA: Diagnosis present

## 2019-12-07 DIAGNOSIS — R0789 Other chest pain: Secondary | ICD-10-CM | POA: Diagnosis not present

## 2019-12-07 DIAGNOSIS — E860 Dehydration: Secondary | ICD-10-CM | POA: Diagnosis not present

## 2019-12-07 LAB — BASIC METABOLIC PANEL
Anion gap: 7 (ref 5–15)
BUN: 11 mg/dL (ref 6–20)
CO2: 26 mmol/L (ref 22–32)
Calcium: 8.7 mg/dL — ABNORMAL LOW (ref 8.9–10.3)
Chloride: 106 mmol/L (ref 98–111)
Creatinine, Ser: 0.67 mg/dL (ref 0.44–1.00)
GFR calc Af Amer: >60 mL/min (ref >60)
GFR calc non Af Amer: >60 mL/min (ref >60)
Glucose, Bld: 116 mg/dL — ABNORMAL HIGH (ref 70–99)
Potassium: 4.3 mmol/L (ref 3.5–5.1)
Sodium: 139 mmol/L (ref 135–145)

## 2019-12-07 LAB — CBC
HCT: 39.3 % (ref 36.0–46.0)
Hemoglobin: 12.4 g/dL (ref 12.0–15.0)
MCH: 26.2 pg (ref 26.0–34.0)
MCHC: 31.6 g/dL (ref 30.0–36.0)
MCV: 83.1 fL (ref 80.0–100.0)
Platelets: 321 10*3/uL (ref 150–400)
RBC: 4.73 MIL/uL (ref 3.87–5.11)
RDW: 13.1 % (ref 11.5–15.5)
WBC: 6.9 10*3/uL (ref 4.0–10.5)
nRBC: 0 % (ref 0.0–0.2)

## 2019-12-07 LAB — TROPONIN I (HIGH SENSITIVITY): Troponin I (High Sensitivity): <2 ng/L (ref <18)

## 2019-12-07 LAB — PREGNANCY, URINE: Preg Test, Ur: NEGATIVE

## 2019-12-07 MED ORDER — SODIUM CHLORIDE 0.9% FLUSH
3.0000 mL | Freq: Once | INTRAVENOUS | Status: DC
  Filled 2019-12-07: qty 3

## 2019-12-07 MED ORDER — KETOROLAC TROMETHAMINE 30 MG/ML IJ SOLN
15.0000 mg | Freq: Once | INTRAMUSCULAR | Status: AC
  Administered 2019-12-07: 15 mg via INTRAVENOUS
  Filled 2019-12-07: qty 1

## 2019-12-07 MED ORDER — SODIUM CHLORIDE 0.9 % IV BOLUS
1000.0000 mL | Freq: Once | INTRAVENOUS | Status: AC
  Administered 2019-12-07: 1000 mL via INTRAVENOUS

## 2019-12-07 NOTE — ED Notes (Signed)
ED Provider at bedside. 

## 2019-12-07 NOTE — ED Provider Notes (Signed)
[facility] EMERGENCY DEPARTMENT Provider Note   CSN: [id number] Arrival date & time: 12/07/19  1157     History Chief Complaint  Patient presents with  . Chest Pain    Maureen Miller is a 34 y.o. female presenting for evaluation of lightheadedness and chest heaviness.  Patient states yesterday at work she became very overheated and hot.  She started to feel lightheaded, like she was going to pass out.  This got worse when she stood up.  Since then, she has continued to have a lightheaded sensation.  She reports a chest heaviness, but no pain.  She has fevers, chills, headache, neck pain, cough, shortness of breath, nausea, vomiting, Donnell pain, urinary symptoms, normal bowel movements.  She has not had anything to eat since yesterday morning, this is atypical for her.  She denies sick contacts.  She reports no other medical problems, takes no medications daily.  Symptoms do not worsen with inspiration.  She denies recent travel, surgeries, immobilization, history of cancer, history of previous DVT/PE, or hormone use.  She does not smoke cigarettes.  She denies personal history of cardiac disease, no family history of early cardiac death.  HPI     Past Medical History:  Diagnosis Date  . Anemia   . Anemia     There are no problems to display for this patient.   Past Surgical History:  Procedure Laterality Date  . CESAREAN SECTION       OB History   No obstetric history on file.     No family history on file.  Social History   Tobacco Use  . Smoking status: Never Smoker  . Smokeless tobacco: Never Used  Vaping Use  . Vaping Use: Never used  Substance Use Topics  . Alcohol use: No  . Drug use: No    Home Medications Prior to Admission medications   Medication Sig Start Date End Date Taking? Authorizing Provider  cephALEXin (KEFLEX) 500 MG capsule Take 1 capsule (500 mg total) by mouth 3 (three) times daily. 03/17/19   Raeford Razor, MD    cyclobenzaprine (FLEXERIL) 10 MG tablet Take 1 tablet (10 mg total) by mouth 2 (two) times daily as needed for muscle spasms. Patient not taking: Reported on 09/15/2018 01/05/18   Mortis, Jerrel Ivory I, PA-C  fluticasone [facility]) 50 MCG/ACT nasal spray Place 2 sprays into both nostrils daily. Patient not taking: Reported on 09/15/2018 03/31/15 09/15/27  Glynn Octave, MD  ibuprofen (ADVIL) 600 MG tablet Take 1 tablet (600 mg total) by mouth every 6 (six) hours as needed. 09/15/18   Horton, Mayer Masker, MD  nitrofurantoin, macrocrystal-monohydrate, (MACROBID) 100 MG capsule Take 1 capsule (100 mg total) by mouth 2 (two) times daily. 03/08/19   Gwyneth Sprout, MD  phenazopyridine (PYRIDIUM) 200 MG tablet Take 1 tablet (200 mg total) by mouth 3 (three) times daily. 03/17/19   Raeford Razor, MD    Allergies    Patient has no known allergies.  Review of Systems   Review of Systems  Respiratory:       Heaviness/tightness of the chest  Neurological: Positive for light-headedness.  All other systems reviewed and are negative.   Physical Exam Updated Vital Signs BP 120/88 (BP Location: Right Arm)   Pulse 72   Temp 97.6 F (36.4 C) (Oral)   Resp 16   Ht 5\' 7"  (1.702 m)   Wt 103.9 kg   LMP 11/30/2019   SpO2 100%   BMI 35.87 kg/m   Physical  Exam Vitals and nursing note reviewed.  Constitutional:      General: She is not in acute distress.    Appearance: She is well-developed.     Comments: Patient appears tired, but otherwise nontoxic  HENT:     Head: Normocephalic and atraumatic.     Mouth/Throat:     Mouth: Mucous membranes are moist.  Eyes:     Conjunctiva/sclera: Conjunctivae normal.     Pupils: Pupils are equal, round, and reactive to light.  Cardiovascular:     Rate and Rhythm: Normal rate and regular rhythm.     Pulses: Normal pulses.  Pulmonary:     Effort: Pulmonary effort is normal. No respiratory distress.     Breath sounds: Normal breath sounds. No wheezing.      Comments: Clear lung sounds in all fields Abdominal:     General: There is no distension.     Palpations: Abdomen is soft. There is no mass.     Tenderness: There is no abdominal tenderness. There is no guarding or rebound.  Musculoskeletal:        General: Normal range of motion.     Cervical back: Normal range of motion and neck supple.  Skin:    General: Skin is warm and dry.     Capillary Refill: Capillary refill takes less than 2 seconds.  Neurological:     Mental Status: She is alert and oriented to person, place, and time.     ED Results / Procedures / Treatments   Labs (all labs ordered are listed, but only abnormal results are displayed) Labs Reviewed  BASIC METABOLIC PANEL - Abnormal; Notable for the following components:      Result Value   Glucose, Bld 116 (*)    Calcium 8.7 (*)    All other components within normal limits  CBC  PREGNANCY, URINE  TROPONIN I (HIGH SENSITIVITY)    EKG EKG Interpretation  Date/Time:  Wednesday December 07 2019 12:04:55 EDT Ventricular Rate:  71 PR Interval:  144 QRS Duration: 74 QT Interval:  370 QTC Calculation: 402 R Axis:   61 Text Interpretation: Normal sinus rhythm Nonspecific T wave abnormality Abnormal ECG Confirmed by Geoffery Lyons ([id number]) on 12/07/2019 5:23:23 PM   Radiology DG Chest 2 View  Result Date: 12/07/2019 CLINICAL DATA:  Chest pain EXAM: CHEST - 2 VIEW COMPARISON:  March 31, 2015 FINDINGS: The lungs are clear. The heart size and pulmonary vascularity are normal. No adenopathy. No pneumothorax. No bone lesions. IMPRESSION: No abnormality noted. Electronically Signed   By: Bretta Bang III M.D.   On: 12/07/2019 12:44    Procedures Procedures (including critical care time)  Medications Ordered in ED Medications  sodium chloride flush (NS) 0.9 % injection 3 mL (3 mLs Intravenous Not Given 12/07/19 1538)  sodium chloride 0.9 % bolus 1,000 mL ( Intravenous Stopped 12/07/19 1739)  ketorolac (TORADOL) 30  MG/ML injection 15 mg (15 mg Intravenous Given 12/07/19 1605)    ED Course  I have reviewed the triage vital signs and the nursing notes.  Pertinent labs & imaging results that were available during my care of the patient were reviewed by me and considered in my medical decision making (see chart for details).    MDM Rules/Calculators/A&P                          Patient presenting for evaluation of lightheadedness and chest heaviness/tightness.  On exam, patient appears nontoxic.  This began when patient became very hot and overheated.  She has not had anything to eat or drink in the past 24 hours, likely contributing to her symptoms.  On exam, patient appears nontoxic.  She has a normal neuro exam.  Fever dehydration.  Consider anemia.  However as patient has a chest heaviness, will obtain EKG, labs, troponin.  Chest x-ray obtained in triage read interpreted by me, no pneumonia, post effusion, cardiomegaly.  Will give fluids, Toradol, encourage p.o., and obtain orthostatics.   Labs interpreted by me, overall reassuring.  Troponin negative.  Leukocytosis.  Hemoglobin stable.  Electrolytes stable.  EKG without ischemia.  Orthostatics show mild change in heart rate upon standing, consistent with dehydration.  On reassessment after fluids n.p.o., patient reports symptoms are improving.  She is less lightheaded when standing, and heaviness has improved.  Discussed likely dehydration.  Discussed continued symptomatic treatment at home, and follow-up with PCP as needed.  At this time, patient appears safe for discharge.  Return precautions given.  Patient states she understands and agrees to plan.  Final Clinical Impression(s) / ED Diagnoses Final diagnoses:  Dehydration    Rx / DC Orders ED Discharge Orders    None       Alveria Apley, PA-C 12/07/19 1959    Geoffery Lyons, MD 12/08/19 0002

## 2019-12-07 NOTE — Discharge Instructions (Addendum)
Make sure you are staying well-hydrated water. Make sure you are eating regular meals. Follow-up with your primary care doctor as needed for further evaluation. Return to emergency room with any new, worsening, concerning symptoms.

## 2019-12-07 NOTE — ED Triage Notes (Addendum)
Pt c/o CP started yesterday-denies cough/fver/flu sx-NAD-steady gait

## 2019-12-15 ENCOUNTER — Emergency Department (INDEPENDENT_AMBULATORY_CARE_PROVIDER_SITE_OTHER)
Admission: EM | Admit: 2019-12-15 | Discharge: 2019-12-15 | Disposition: A | Discharge status: Home / Self Care | Payer: Medicaid Other | Source: Home / Self Care

## 2019-12-15 ENCOUNTER — Other Ambulatory Visit: Payer: Self-pay

## 2019-12-15 DIAGNOSIS — R42 Dizziness and giddiness: Secondary | ICD-10-CM

## 2019-12-15 DIAGNOSIS — R1030 Lower abdominal pain, unspecified: Secondary | ICD-10-CM

## 2019-12-15 DIAGNOSIS — R200 Anesthesia of skin: Secondary | ICD-10-CM

## 2019-12-15 DIAGNOSIS — R531 Weakness: Secondary | ICD-10-CM

## 2019-12-15 NOTE — ED Provider Notes (Signed)
[facility]    CSN: [id number] Arrival date & time: 12/15/19  1953      History   Chief Complaint Chief Complaint  Patient presents with  . Dizziness    Lightheaded  . Fatigue    Weakness    HPI Maureen Miller is a 34 y.o. female.   HPI  Maureen Miller is a 34 y.o. female presenting to UC with multiple complaints including intermittent fatigue, weakness and dizziness for 1-2 weeks, Right sided leg numbness that lasted for 30 minutes yesterday- resolved after lying down, and c/o lower abdominal pain that is cramping and sharp for about 1 week. Pt was seen at [facility] last week, dx with dehydration, was given fluids and encouraged to f/u with PCP. Pt has not f/u.  She left work early due to feeling bad this evening.   Denies n/v/d. Denies fever, chills, cough, congestion, chest pain or SOB. No new medications. Hx of anemia, however, labs last week were normal.  No recent heavy periods, pt states her period was much lighter the other week with just spotting rather than her usual heavy cycle. She is not on birth control and does not always use condoms.  LMP: 11/30/19   Past Medical History:  Diagnosis Date  . Anemia   . Anemia     There are no problems to display for this patient.   Past Surgical History:  Procedure Laterality Date  . CESAREAN SECTION      OB History   No obstetric history on file.      Home Medications    Prior to Admission medications   Medication Sig Start Date End Date Taking? Authorizing Provider  cephALEXin (KEFLEX) 500 MG capsule Take 1 capsule (500 mg total) by mouth 3 (three) times daily. 03/17/19   Raeford Razor, MD  cyclobenzaprine (FLEXERIL) 10 MG tablet Take 1 tablet (10 mg total) by mouth 2 (two) times daily as needed for muscle spasms. Patient not taking: Reported on 09/15/2018 01/05/18   Mortis, Jerrel Ivory I, PA-C  fluticasone [facility]) 50 MCG/ACT nasal spray Place 2 sprays into both nostrils daily. Patient not  taking: Reported on 09/15/2018 03/31/15 09/15/27  Glynn Octave, MD  ibuprofen (ADVIL) 600 MG tablet Take 1 tablet (600 mg total) by mouth every 6 (six) hours as needed. 09/15/18   Horton, Mayer Masker, MD  nitrofurantoin, macrocrystal-monohydrate, (MACROBID) 100 MG capsule Take 1 capsule (100 mg total) by mouth 2 (two) times daily. 03/08/19   Gwyneth Sprout, MD  phenazopyridine (PYRIDIUM) 200 MG tablet Take 1 tablet (200 mg total) by mouth 3 (three) times daily. 03/17/19   Raeford Razor, MD    Family History History reviewed. No pertinent family history.  Social History Social History   Tobacco Use  . Smoking status: Never Smoker  . Smokeless tobacco: Never Used  Vaping Use  . Vaping Use: Never used  Substance Use Topics  . Alcohol use: No  . Drug use: No     Allergies   Patient has no known allergies.   Review of Systems Review of Systems  Constitutional: Positive for fatigue. Negative for chills and fever.  HENT: Negative for congestion, ear pain, sore throat, trouble swallowing and voice change.   Respiratory: Negative for cough and shortness of breath.   Cardiovascular: Negative for chest pain and palpitations.  Gastrointestinal: Positive for abdominal pain. Negative for diarrhea, nausea and vomiting.  Genitourinary: Positive for menstrual problem and pelvic pain. Negative for dysuria, flank pain, frequency, hematuria, urgency,  vaginal bleeding, vaginal discharge and vaginal pain.  Musculoskeletal: Negative for arthralgias, back pain and myalgias.  Skin: Negative for rash.  Neurological: Positive for dizziness, weakness, light-headedness and numbness. Negative for headaches.  All other systems reviewed and are negative.    Physical Exam Triage Vital Signs ED Triage Vitals  Enc Vitals Group     BP      Pulse      Resp      Temp      Temp src      SpO2      Weight      Height      Head Circumference      Peak Flow      Pain Score      Pain Loc      Pain  Edu?      Excl. in [place]?    No data found.  Updated Vital Signs BP 118/78 (BP Location: Left Arm)   Pulse 85   Temp 98.3 F (36.8 C) (Oral)   Resp 16   LMP 11/30/2019   SpO2 98%   Visual Acuity Right Eye Distance:   Left Eye Distance:   Bilateral Distance:    Right Eye Near:   Left Eye Near:    Bilateral Near:     Physical Exam Vitals and nursing note reviewed.  Constitutional:      General: She is not in acute distress.    Appearance: Normal appearance. She is well-developed. She is not ill-appearing, toxic-appearing or diaphoretic.  HENT:     Head: Normocephalic and atraumatic.     Right Ear: Tympanic membrane and ear canal normal.     Left Ear: Tympanic membrane and ear canal normal.     Nose: Nose normal.     Mouth/Throat:     Lips: Pink.     Pharynx: Oropharynx is clear. Uvula midline.  Eyes:     Extraocular Movements: Extraocular movements intact.     Conjunctiva/sclera: Conjunctivae normal.     Pupils: Pupils are equal, round, and reactive to light.  Cardiovascular:     Rate and Rhythm: Normal rate and regular rhythm.  Pulmonary:     Effort: Pulmonary effort is normal. No respiratory distress.     Breath sounds: Normal breath sounds. No stridor. No wheezing, rhonchi or rales.  Abdominal:     General: There is no distension.     Palpations: Abdomen is soft. There is no mass.     Tenderness: There is abdominal tenderness ( across lower abdomen, mainly suprapubic). There is no right CVA tenderness, left CVA tenderness, guarding or rebound.     Hernia: No hernia is present.  Musculoskeletal:        General: Normal range of motion.     Cervical back: Normal range of motion.  Skin:    General: Skin is warm and dry.     Capillary Refill: Capillary refill takes less than 2 seconds.  Neurological:     General: No focal deficit present.     Mental Status: She is alert and oriented to person, place, and time.  Psychiatric:        Mood and Affect: Mood normal.          Behavior: Behavior normal.      UC Treatments / Results  Labs (all labs ordered are listed, but only abnormal results are displayed) Labs Reviewed - No data to display  EKG   Radiology No results found.  Procedures Procedures (including critical  care time)  Medications Ordered in UC Medications - No data to display  Initial Impression / Assessment and Plan / UC Course  I have reviewed the triage vital signs and the nursing notes.  Pertinent labs & imaging results that were available during my care of the patient were reviewed by me and considered in my medical decision making (see chart for details).    Pt appears well, NAD, vitals: WNL However, due to reported abdominal pain, abnormal menses this month, infrequent use of condoms, and continued weakness/fatigue, recommend further evaluation in emergency department with ability to perform stat labs and imaging. Pt understanding and agreeable to drive herself to [facility] Emergency Department. She declined EMS transport but appears to be in good condition at time of discharge.  AVS provided.   Final Clinical Impressions(s) / UC Diagnoses   Final diagnoses:  Lightheaded  Weakness  Lower abdominal pain  Right sided numbness     Discharge Instructions      Because of your ongoing symptoms and new lower abdominal pain and intermittent Right side weakness, it is recommended you are evaluated further in the emergency department. You have declined EMS transport. Please drive directly to the hospital.  If you need to, pull over to a safe area and call 911.     ED Prescriptions    None     PDMP not reviewed this encounter.   Lurene Shadow, [place] 12/16/19 [phone]

## 2019-12-15 NOTE — ED Triage Notes (Signed)
Pt c/o lightheadedness/dizziness and weakness. Some tingling/numbness in RT side of body yesterday and sharp pains in lower back. Was seen in ED last week for similar sxs, given IV and toradol meds. Dx with dehydration. Some chest tightness today but not as bad as last week when seen in ED.

## 2019-12-15 NOTE — ED Notes (Signed)
Patient is being discharged from the Urgent Care and sent to the Emergency Department via POV. Per Waylan Rocher PAC, patient is in need of higher level of care and further eval due to abd pain and weakness/dizziness. Patient is aware and verbalizes understanding of plan of care.  Vitals:   12/15/19 2008  BP: 118/78  Pulse: 85  Resp: 16  Temp: 98.3 F (36.8 C)  SpO2: 98%

## 2019-12-15 NOTE — Discharge Instructions (Signed)
  Because of your ongoing symptoms and new lower abdominal pain and intermittent Right side weakness, it is recommended you are evaluated further in the emergency department. You have declined EMS transport. Please drive directly to the hospital.  If you need to, pull over to a safe area and call 911.

## 2020-05-30 ENCOUNTER — Encounter (HOSPITAL_BASED_OUTPATIENT_CLINIC_OR_DEPARTMENT_OTHER): Payer: Self-pay

## 2020-05-30 ENCOUNTER — Emergency Department (HOSPITAL_BASED_OUTPATIENT_CLINIC_OR_DEPARTMENT_OTHER)
Admission: EM | Admit: 2020-05-30 | Discharge: 2020-05-30 | Disposition: A | Discharge status: Home / Self Care | Payer: Medicaid Other | Attending: Emergency Medicine | Admitting: Emergency Medicine

## 2020-05-30 ENCOUNTER — Other Ambulatory Visit: Payer: Self-pay

## 2020-05-30 DIAGNOSIS — R058 Other specified cough: Secondary | ICD-10-CM

## 2020-05-30 DIAGNOSIS — U071 COVID-19: Secondary | ICD-10-CM | POA: Insufficient documentation

## 2020-05-30 DIAGNOSIS — R519 Headache, unspecified: Secondary | ICD-10-CM | POA: Diagnosis present

## 2020-05-30 DIAGNOSIS — Z20822 Contact with and (suspected) exposure to covid-19: Secondary | ICD-10-CM

## 2020-05-30 LAB — RESP PANEL BY RT-PCR (FLU A&B, COVID) ARPGX2
Influenza A by PCR: NEGATIVE
Influenza B by PCR: NEGATIVE
SARS Coronavirus 2 by RT PCR: POSITIVE — AB

## 2020-05-30 NOTE — ED Triage Notes (Signed)
Pt arrives with reports of being exposed to Covid on Sunday with symptoms starting on Monday. Pt states symptoms of fever, chills, sore throat, and body aches. Pt is not vaccinated.

## 2020-05-30 NOTE — ED Provider Notes (Signed)
[facility] EMERGENCY DEPARTMENT Provider Note   CSN: [id number] Arrival date & time: 05/30/20  0915     History Chief Complaint  Patient presents with  . Covid Exposure    Maureen Miller is a 35 y.o. female.  HPI   Patient with no significant medical history presents to the emergency department with chief complaint of URI-like symptoms.  Patient states symptoms started on Sunday and have progressively gotten worse.  She endorses headaches, subjective fevers and chills, nasal congestion, cough, general body aches.  She denies sore throat, chest pain, shortness of breath, nausea, vomiting, diarrhea.  She endorses that she was recently exposed to someone who is diagnosed with COVID-19, she is not vaccine against Covid, is not immunocompromise at this time.  She denies alleviating factors.  She states she is tolerating p.o. without difficulty.    Past Medical History:  Diagnosis Date  . Anemia   . Anemia     There are no problems to display for this patient.   Past Surgical History:  Procedure Laterality Date  . CESAREAN SECTION       OB History   No obstetric history on file.     No family history on file.  Social History   Tobacco Use  . Smoking status: Never Smoker  . Smokeless tobacco: Never Used  Vaping Use  . Vaping Use: Never used  Substance Use Topics  . Alcohol use: No  . Drug use: No    Home Medications Prior to Admission medications   Medication Sig Start Date End Date Taking? Authorizing Provider  cephALEXin (KEFLEX) 500 MG capsule Take 1 capsule (500 mg total) by mouth 3 (three) times daily. 03/17/19   Raeford Razor, MD  cyclobenzaprine (FLEXERIL) 10 MG tablet Take 1 tablet (10 mg total) by mouth 2 (two) times daily as needed for muscle spasms. Patient not taking: Reported on 09/15/2018 01/05/18   Mortis, Jerrel Ivory I, PA-C  fluticasone [facility]) 50 MCG/ACT nasal spray Place 2 sprays into both nostrils daily. Patient not taking: Reported on  09/15/2018 03/31/15 09/15/27  Glynn Octave, MD  ibuprofen (ADVIL) 600 MG tablet Take 1 tablet (600 mg total) by mouth every 6 (six) hours as needed. 09/15/18   Horton, Mayer Masker, MD  nitrofurantoin, macrocrystal-monohydrate, (MACROBID) 100 MG capsule Take 1 capsule (100 mg total) by mouth 2 (two) times daily. 03/08/19   Gwyneth Sprout, MD  phenazopyridine (PYRIDIUM) 200 MG tablet Take 1 tablet (200 mg total) by mouth 3 (three) times daily. 03/17/19   Raeford Razor, MD    Allergies    Patient has no known allergies.  Review of Systems   Review of Systems  Constitutional: Positive for chills and fever.  HENT: Positive for congestion. Negative for sore throat.   Respiratory: Positive for cough. Negative for shortness of breath.   Cardiovascular: Negative for chest pain.  Gastrointestinal: Negative for abdominal pain, diarrhea, nausea and vomiting.  Genitourinary: Negative for dysuria and enuresis.  Musculoskeletal: Positive for myalgias.  Skin: Negative for rash.  Neurological: Negative for headaches.  Hematological: Does not bruise/bleed easily.    Physical Exam Updated Vital Signs BP 112/81 (BP Location: Right Arm)   Pulse 88   Temp (S) 98.7 F (37.1 C) (Oral) Comment: last medicated around 5 am today, highest temp at home 103 per pt  Resp 16   Ht 5\' 7"  (1.702 m)   Wt 97.5 kg   LMP 05/16/2020   SpO2 100%   BMI 33.67 kg/m   Physical  Exam Vitals and nursing note reviewed.  Constitutional:      General: She is not in acute distress.    Appearance: She is not ill-appearing.  HENT:     Head: Normocephalic and atraumatic.     Right Ear: Tympanic membrane, ear canal and external ear normal.     Left Ear: Tympanic membrane, ear canal and external ear normal.     Nose: Congestion present.     Mouth/Throat:     Mouth: Mucous membranes are moist.     Pharynx: Oropharynx is clear. No oropharyngeal exudate or posterior oropharyngeal erythema.  Eyes:     Conjunctiva/sclera:  Conjunctivae normal.  Cardiovascular:     Rate and Rhythm: Normal rate and regular rhythm.     Pulses: Normal pulses.     Heart sounds: No murmur heard. No friction rub. No gallop.   Pulmonary:     Effort: No respiratory distress.     Breath sounds: No wheezing, rhonchi or rales.  Abdominal:     Palpations: Abdomen is soft.     Tenderness: There is no abdominal tenderness.  Musculoskeletal:     Comments: Patient is moving all 4 extremities at difficulty.  Skin:    General: Skin is warm and dry.  Neurological:     Mental Status: She is alert.  Psychiatric:        Mood and Affect: Mood normal.     ED Results / Procedures / Treatments   Labs (all labs ordered are listed, but only abnormal results are displayed) Labs Reviewed  RESP PANEL BY RT-PCR (FLU A&B, COVID) [id number]    EKG None  Radiology No results found.  Procedures Procedures (including critical care time)  Medications Ordered in ED Medications - No data to display  ED Course  I have reviewed the triage vital signs and the nursing notes.  Pertinent labs & imaging results that were available during my care of the patient were reviewed by me and considered in my medical decision making (see chart for details).    MDM Rules/Calculators/A&P                          Patient presents with URI-like symptoms.  She is alert, does not appear in acute distress, vital signs reassuring.  Will obtain respiratory panel for further evaluation.  Low suspicion for systemic infection as patient is nontoxic-appearing, vital signs reassuring, no obvious source infection noted on exam.  Low suspicion for pneumonia as lung sounds are clear bilaterally, will defer x-ray at this time.  I have low suspicion for PE as patient denies pleuritic chest pain, shortness of breath, patient is PERC. low suspicion for strep throat as oropharynx was visualized, no erythema or exudates noted.  Low suspicion patient would need  hospitalized due to  viral infection or Covid as vital signs reassuring, patient is not in respiratory distress.  Suspect patient suffering from a viral URI will recommend over-the-counter pain medications and following up with post Covid care if Covid positive.  Patient does not meet criteria for infusion if Covid positive.  Vital signs have remained stable, no indication for hospital admission. Patient given at home care as well strict return precautions.  Patient verbalized that they understood agreed to said plan.   Final Clinical Impression(s) / ED Diagnoses Final diagnoses:  Cough with exposure to COVID-19 virus    Rx / DC Orders ED Discharge Orders    None  Marcello Fennel, PA-C 05/30/20 1228    Margette Fast, MD 06/01/20 [phone]

## 2020-05-30 NOTE — Discharge Instructions (Addendum)
You have been seen here for URI like symptoms.  I recommend taking Tylenol for fever control and ibuprofen for pain control please follow dosing on the back of bottle.  I recommend staying hydrated and if you do not an appetite, I recommend soups as this will provide you with fluids and calories.  Your Covid test is pending I recommend self quarantine until you get your results back on MyChart.  If you are Covid positive you must self quarantine for 5 days starting on symptom onset.  If at the end of those 5 days you are feeling better you may return back to work, if you continue to have symptoms you must self quarantine for additional 5 days.    I would like you to contact "post Covid care" as they will provide you with information how to manage your Covid symptoms.  If Covid negative you may follow-up with your primary care provider in 1 week's time  Come back to the emergency department if you develop chest pain, shortness of breath, severe abdominal pain, uncontrolled nausea, vomiting, diarrhea.

## 2020-05-30 NOTE — ED Notes (Signed)
Pt states she is not vaccinated because she doesn't want it , had an exposure and developed s/s with cough and body aches  Pt here for test

## 2020-09-25 ENCOUNTER — Other Ambulatory Visit: Payer: Self-pay | Admitting: Physician Assistant

## 2020-09-25 DIAGNOSIS — N6320 Unspecified lump in the left breast, unspecified quadrant: Secondary | ICD-10-CM

## 2021-01-14 ENCOUNTER — Inpatient Hospital Stay (HOSPITAL_COMMUNITY)
Admission: AD | Admit: 2021-01-14 | Discharge: 2021-01-15 | Disposition: A | Discharge status: Home / Self Care | Payer: PRIVATE HEALTH INSURANCE | Attending: Family Medicine | Admitting: Family Medicine

## 2021-01-14 ENCOUNTER — Encounter (HOSPITAL_COMMUNITY): Payer: Self-pay | Admitting: Obstetrics & Gynecology

## 2021-01-14 DIAGNOSIS — M549 Dorsalgia, unspecified: Secondary | ICD-10-CM | POA: Diagnosis not present

## 2021-01-14 DIAGNOSIS — O26892 Other specified pregnancy related conditions, second trimester: Secondary | ICD-10-CM | POA: Diagnosis not present

## 2021-01-14 DIAGNOSIS — R109 Unspecified abdominal pain: Secondary | ICD-10-CM | POA: Insufficient documentation

## 2021-01-14 DIAGNOSIS — Z3A15 15 weeks gestation of pregnancy: Secondary | ICD-10-CM | POA: Diagnosis not present

## 2021-01-14 DIAGNOSIS — R519 Headache, unspecified: Secondary | ICD-10-CM | POA: Diagnosis not present

## 2021-01-14 DIAGNOSIS — M545 Low back pain, unspecified: Secondary | ICD-10-CM | POA: Insufficient documentation

## 2021-01-14 DIAGNOSIS — K5900 Constipation, unspecified: Secondary | ICD-10-CM | POA: Diagnosis not present

## 2021-01-14 DIAGNOSIS — Z79899 Other long term (current) drug therapy: Secondary | ICD-10-CM | POA: Insufficient documentation

## 2021-01-14 DIAGNOSIS — O26899 Other specified pregnancy related conditions, unspecified trimester: Secondary | ICD-10-CM

## 2021-01-14 DIAGNOSIS — O99891 Other specified diseases and conditions complicating pregnancy: Secondary | ICD-10-CM | POA: Diagnosis not present

## 2021-01-14 DIAGNOSIS — O99612 Diseases of the digestive system complicating pregnancy, second trimester: Secondary | ICD-10-CM | POA: Diagnosis not present

## 2021-01-14 LAB — WET PREP, GENITAL
Clue Cells Wet Prep HPF POC: NONE SEEN
Sperm: NONE SEEN
Trich, Wet Prep: NONE SEEN
Yeast Wet Prep HPF POC: NONE SEEN

## 2021-01-14 LAB — URINALYSIS, ROUTINE W REFLEX MICROSCOPIC
Bilirubin Urine: NEGATIVE
Glucose, UA: NEGATIVE mg/dL
Hgb urine dipstick: NEGATIVE
Ketones, ur: NEGATIVE mg/dL
Nitrite: NEGATIVE
Protein, ur: 30 mg/dL — AB
Specific Gravity, Urine: 1.027 (ref 1.005–1.030)
pH: 6 (ref 5.0–8.0)

## 2021-01-14 MED ORDER — CYCLOBENZAPRINE HCL 5 MG PO TABS
10.0000 mg | ORAL_TABLET | Freq: Once | ORAL | Status: AC
  Administered 2021-01-14: 10 mg via ORAL
  Filled 2021-01-14: qty 2

## 2021-01-14 MED ORDER — ACETAMINOPHEN 500 MG PO TABS
1000.0000 mg | ORAL_TABLET | Freq: Once | ORAL | Status: AC
  Administered 2021-01-14: 1000 mg via ORAL
  Filled 2021-01-14: qty 2

## 2021-01-14 MED ORDER — CYCLOBENZAPRINE HCL 5 MG PO TABS: 5.0000 mg | ORAL_TABLET | Freq: Three times a day (TID) | ORAL | 0 refills | Status: DC | PRN

## 2021-01-14 NOTE — MAU Note (Signed)
Been having headaches for the last 3 days.  Been having abd pain, back pain.  Just overall exhausted.   Called her dr, they just recommended Tylenol- did not take any.

## 2021-01-14 NOTE — MAU Provider Note (Signed)
History     CSN: [id number]  Arrival date and time: 01/14/21 1656   Event Date/Time   First Provider Initiated Contact with Patient 01/14/21 2147      Chief Complaint  Patient presents with   Abdominal Pain   Back Pain   Headache   HPI Maureen Miller is a 35 y.o. G2P1001 at [redacted]w[redacted]d who presents with headache, abdominal pain, and pain. Reports intermittent headache for the last 3 days which she has been taking Tylenol for with minimal relief. Has had lower abdominal cramping and low back pain for the last few weeks.  Back pain worsened today.  Has been taking Tylenol occasionally without relief.  Last took Tylenol 2 days ago.  Rates pain 7/10.  Nothing makes better or worse.  Denies fever, vomiting, vaginal bleeding, or dysuria.  Does report issues with constipation.  Last bowel movement was yesterday but was small.  Has not been treating constipation.  OB History     Gravida  2   Para  1   Term  1   Preterm      AB      Living  1      SAB      IAB      Ectopic      Multiple      Live Births              Past Medical History:  Diagnosis Date   Anemia    Anemia     Past Surgical History:  Procedure Laterality Date   CESAREAN SECTION      History reviewed. No pertinent family history.  Social History   Tobacco Use   Smoking status: Never   Smokeless tobacco: Never  Vaping Use   Vaping Use: Never used  Substance Use Topics   Alcohol use: No   Drug use: No    Allergies:  Allergies  Allergen Reactions   Latex Hives    Medications Prior to Admission  Medication Sig Dispense Refill Last Dose   Prenatal Vit-Fe Fumarate-FA (PRENATAL MULTIVITAMIN) TABS tablet Take 1 tablet by mouth daily at 12 noon.   01/14/2021   cephALEXin (KEFLEX) 500 MG capsule Take 1 capsule (500 mg total) by mouth 3 (three) times daily. 21 capsule 0    cyclobenzaprine (FLEXERIL) 10 MG tablet Take 1 tablet (10 mg total) by mouth 2 (two) times daily as needed for muscle  spasms. (Patient not taking: No sig reported) 20 tablet 0    fluticasone (FLONASE) 50 MCG/ACT nasal spray Place 2 sprays into both nostrils daily. (Patient not taking: No sig reported) 9.9 g 0    ibuprofen (ADVIL) 600 MG tablet Take 1 tablet (600 mg total) by mouth every 6 (six) hours as needed. 30 tablet 0    nitrofurantoin, macrocrystal-monohydrate, (MACROBID) 100 MG capsule Take 1 capsule (100 mg total) by mouth 2 (two) times daily. 10 capsule 0    phenazopyridine (PYRIDIUM) 200 MG tablet Take 1 tablet (200 mg total) by mouth 3 (three) times daily. 6 tablet 0     Review of Systems  Constitutional: Negative.   Gastrointestinal:  Positive for abdominal pain and constipation. Negative for diarrhea, nausea and vomiting.  Genitourinary: Negative.   Musculoskeletal:  Positive for back pain.  Neurological:  Positive for headaches.  Physical Exam   Blood pressure 115/70, pulse 71, temperature 98 F (36.7 C), resp. rate 16, height 5\' 7"  (1.702 m), weight 101.6 kg, last menstrual period 05/16/2020, SpO2 100 %.  Physical Exam Vitals and nursing note reviewed. Exam conducted with a chaperone present.  Constitutional:      General: She is not in acute distress.    Appearance: She is well-developed. She is not ill-appearing.  HENT:     Head: Normocephalic and atraumatic.  Eyes:     General: No scleral icterus. Pulmonary:     Effort: Pulmonary effort is normal. No respiratory distress.  Abdominal:     General: There is no distension.     Tenderness: There is no abdominal tenderness. There is no right CVA tenderness or left CVA tenderness.  Genitourinary:    Comments: NEFG, cervix closed/thick Musculoskeletal:     Thoracic back: Normal.     Lumbar back: Normal.  Skin:    General: Skin is warm and dry.  Neurological:     Mental Status: She is alert.  Psychiatric:        Mood and Affect: Mood normal.        Behavior: Behavior normal.    MAU Course  Procedures Results for orders  placed or performed during the hospital encounter of 01/14/21 (from the past 24 hour(s))  Urinalysis, Routine w reflex microscopic Urine, Clean Catch     Status: Abnormal   Collection Time: 01/14/21  6:41 PM  Result Value Ref Range   Color, Urine YELLOW YELLOW   APPearance HAZY (A) CLEAR   Specific Gravity, Urine 1.027 1.005 - 1.030   pH 6.0 5.0 - 8.0   Glucose, UA NEGATIVE NEGATIVE mg/dL   Hgb urine dipstick NEGATIVE NEGATIVE   Bilirubin Urine NEGATIVE NEGATIVE   Ketones, ur NEGATIVE NEGATIVE mg/dL   Protein, ur 30 (A) NEGATIVE mg/dL   Nitrite NEGATIVE NEGATIVE   Leukocytes,Ua TRACE (A) NEGATIVE   RBC / HPF 0-5 0 - 5 RBC/hpf   WBC, UA 0-5 0 - 5 WBC/hpf   Bacteria, UA RARE (A) NONE SEEN   Squamous Epithelial / LPF 21-50 0 - 5   Mucus PRESENT   Wet prep, genital     Status: Abnormal   Collection Time: 01/14/21 10:32 PM  Result Value Ref Range   Yeast Wet Prep HPF POC NONE SEEN NONE SEEN   Trich, Wet Prep NONE SEEN NONE SEEN   Clue Cells Wet Prep HPF POC NONE SEEN NONE SEEN   WBC, Wet Prep HPF POC MANY (A) NONE SEEN   Sperm NONE SEEN     MDM Patient presents with headache, abdominal pain, and back pain that has been going on for the last 2 weeks. Heart tones present via Doppler and cervix is closed.  Urinalysis with trace leuks, no urinary complaints, will send for culture.  Requests vaginal swabs; wet prep negative, GC/CT pending.  Given Tylenol and Flexeril in MAU and reports improvement in symptoms.  Suspect back pain is musculoskeletal however abdominal pain and back pain could be exacerbated by her constipation.  Discussed treatment of constipation at home and will be having written information.  Assessment and Plan   1. Back pain affecting pregnancy in second trimester   2. Abdominal cramping affecting pregnancy   3. Constipation during pregnancy in second trimester   4. [redacted] weeks gestation of pregnancy    -Rx flexeril prn -water, fiber, stool softeners for  constipation -f/u with ob  Judeth Horn 01/14/2021, 9:47 PM

## 2021-01-14 NOTE — Discharge Instructions (Signed)
Safe Medications in Pregnancy   Acne: Benzoyl Peroxide Salicylic Acid  Backache/Headache: Tylenol: 2 regular strength every 4 hours OR              2 Extra strength every 6 hours  Colds/Coughs/Allergies: Benadryl (alcohol free) 25 mg every 6 hours as needed Breath right strips Claritin Cepacol throat lozenges Chloraseptic throat spray Cold-Eeze- up to three times per day Cough drops, alcohol free Flonase (by prescription only) Guaifenesin Mucinex Robitussin DM (plain only, alcohol free) Saline nasal spray/drops Sudafed (pseudoephedrine) & Actifed ** use only after [redacted] weeks gestation and if you do not have high blood pressure Tylenol Vicks Vaporub Zinc lozenges Zyrtec   Constipation: Colace Ducolax suppositories (or oral) Fleet enema Glycerin suppositories Metamucil Milk of magnesia Miralax Senokot Smooth move tea  Diarrhea: Kaopectate Imodium A-D  *NO pepto Bismol  Hemorrhoids: Anusol Anusol HC Preparation H Tucks  Indigestion: Tums Maalox Mylanta Zantac  Pepcid  Insomnia: Benadryl (alcohol free) 25mg  every 6 hours as needed Tylenol PM Unisom, no Gelcaps  Leg Cramps: Tums MagGel  Nausea/Vomiting:  Bonine Dramamine Emetrol Ginger extract Sea bands Meclizine  Nausea medication to take during pregnancy:  Unisom (doxylamine succinate 25 mg tablets) Take one tablet daily at bedtime. If symptoms are not adequately controlled, the dose can be increased to a maximum recommended dose of two tablets daily (1/2 tablet in the morning, 1/2 tablet mid-afternoon and one at bedtime). Vitamin B6 100mg  tablets. Take one tablet twice a day (up to 200 mg per day).  Skin Rashes: Aveeno products Benadryl cream or 25mg  every 6 hours as needed Calamine Lotion 1% cortisone cream  Yeast infection: Gyne-lotrimin 7 Monistat 7  Gum/tooth pain: Anbesol  **If taking multiple medications, please check labels to avoid duplicating the same active  ingredients **take medication as directed on the label ** Do not exceed 4000 mg of tylenol in 24 hours **Do not take medications that contain aspirin or ibuprofen

## 2021-01-15 LAB — GC/CHLAMYDIA PROBE AMP (~~LOC~~) NOT AT ARMC
Chlamydia: NEGATIVE
Comment: NEGATIVE
Comment: NORMAL
Neisseria Gonorrhea: NEGATIVE

## 2021-02-19 ENCOUNTER — Other Ambulatory Visit: Payer: Self-pay

## 2021-02-19 ENCOUNTER — Inpatient Hospital Stay (HOSPITAL_COMMUNITY)
Admission: AD | Admit: 2021-02-19 | Discharge: 2021-02-19 | Disposition: A | Discharge status: Home / Self Care | Payer: PRIVATE HEALTH INSURANCE | Attending: Obstetrics & Gynecology | Admitting: Obstetrics & Gynecology

## 2021-02-19 DIAGNOSIS — O99612 Diseases of the digestive system complicating pregnancy, second trimester: Secondary | ICD-10-CM | POA: Insufficient documentation

## 2021-02-19 DIAGNOSIS — R52 Pain, unspecified: Secondary | ICD-10-CM

## 2021-02-19 DIAGNOSIS — Z28310 Unvaccinated for covid-19: Secondary | ICD-10-CM | POA: Diagnosis not present

## 2021-02-19 DIAGNOSIS — K219 Gastro-esophageal reflux disease without esophagitis: Secondary | ICD-10-CM | POA: Diagnosis not present

## 2021-02-19 DIAGNOSIS — M7918 Myalgia, other site: Secondary | ICD-10-CM | POA: Diagnosis not present

## 2021-02-19 DIAGNOSIS — Z3A20 20 weeks gestation of pregnancy: Secondary | ICD-10-CM | POA: Diagnosis not present

## 2021-02-19 DIAGNOSIS — O26812 Pregnancy related exhaustion and fatigue, second trimester: Secondary | ICD-10-CM

## 2021-02-19 DIAGNOSIS — O219 Vomiting of pregnancy, unspecified: Secondary | ICD-10-CM | POA: Diagnosis not present

## 2021-02-19 DIAGNOSIS — O99891 Other specified diseases and conditions complicating pregnancy: Secondary | ICD-10-CM | POA: Diagnosis not present

## 2021-02-19 DIAGNOSIS — O26892 Other specified pregnancy related conditions, second trimester: Secondary | ICD-10-CM | POA: Insufficient documentation

## 2021-02-19 DIAGNOSIS — R1030 Lower abdominal pain, unspecified: Secondary | ICD-10-CM | POA: Diagnosis not present

## 2021-02-19 DIAGNOSIS — Z79899 Other long term (current) drug therapy: Secondary | ICD-10-CM | POA: Diagnosis not present

## 2021-02-19 DIAGNOSIS — Z20822 Contact with and (suspected) exposure to covid-19: Secondary | ICD-10-CM | POA: Diagnosis not present

## 2021-02-19 DIAGNOSIS — R202 Paresthesia of skin: Secondary | ICD-10-CM | POA: Insufficient documentation

## 2021-02-19 LAB — RESP PANEL BY RT-PCR (FLU A&B, COVID) ARPGX2
Influenza A by PCR: NEGATIVE
Influenza B by PCR: NEGATIVE
SARS Coronavirus 2 by RT PCR: NEGATIVE

## 2021-02-19 MED ORDER — OMEPRAZOLE MAGNESIUM 20 MG PO TBEC: 20.0000 mg | DELAYED_RELEASE_TABLET | Freq: Every day | ORAL | 0 refills | Status: AC

## 2021-02-19 MED ORDER — PROMETHAZINE HCL 25 MG PO TABS: 25.0000 mg | ORAL_TABLET | Freq: Four times a day (QID) | ORAL | 0 refills | Status: AC | PRN

## 2021-02-19 MED ORDER — CYCLOBENZAPRINE HCL 5 MG PO TABS: 5.0000 mg | ORAL_TABLET | Freq: Three times a day (TID) | ORAL | 1 refills | Status: DC | PRN

## 2021-02-19 MED ORDER — FAMOTIDINE 10 MG PO TABS: 10.0000 mg | ORAL_TABLET | Freq: Two times a day (BID) | ORAL | 0 refills | Status: AC

## 2021-02-19 MED ORDER — ACETAMINOPHEN 325 MG PO TABS: 650.0000 mg | ORAL_TABLET | ORAL | 0 refills | Status: AC | PRN

## 2021-02-19 NOTE — MAU Note (Addendum)
Presents stating she's having "bubbling" in her stomach/cramping, "burning" in her muscles, and tingling in her right leg.  States "when I woke up, I couldn't move".  Denies VB or LOF.

## 2021-02-19 NOTE — MAU Provider Note (Signed)
History     CSN: [id number]  Arrival date and time: 02/19/21 5102   Event Date/Time   First Provider Initiated Contact with Patient 02/19/21 [phone]      Chief Complaint  Patient presents with   Cramping   Burning in Muscles    HPI Maureen Miller is a 35 y.o. G2P1001 at [redacted]w[redacted]d who presents to MAU with multiple complaints:  "Stomach Bubbling" This is a new problem, onset overnight. Patient endorses recurrent "bubbles" generalized to her entire abdomen.  Body aches This is a new problem, onset overnight and continuing on arrival to MAU. Patient states that all the joints in her body feel as if they "are on fire". She denies fever, cough, SOB. She did not receive a COVID vaccine series. No one in her social circle is sick.  Lower abdominal cramping This is a recurrent problem. Patient endorses generalized "cramps" across her lower abdomen. Pain score 9/10. Pain does not radiate. She has not taken medication or tried other treatments for this complaint.  "Tingling" in right leg Patient endorses waking up this morning to feel tingling down her right leg. She states she felt as if she couldn't move. She denies difficulty walking. She denies calf pain, lower extremity swelling, discoloration, falls.  Vomiting This is a recurrent problem. Patient states she has struggled with vomiting throughout her pregnancy. She states she discusses this at her prenatal appointments "but they don't listen to me".   She denies contraction pain, vaginal bleeding, DFM, abnormal vaginal discharge, dysuria, fever.  She denies SOB, chest pain, palpitations, activity intolerance, difficulty ambulating, difficulty performing ADLs.  Patient receives care with [facility]. Patient states she was supposed to be at work at [employer] but called out due to feeling unwell.  OB History     Gravida  2   Para  1   Term  1   Preterm      AB      Living  1      SAB      IAB      Ectopic      Multiple       Live Births              Past Medical History:  Diagnosis Date   Anemia    Anemia     Past Surgical History:  Procedure Laterality Date   CESAREAN SECTION      No family history on file.  Social History   Tobacco Use   Smoking status: Never   Smokeless tobacco: Never  Vaping Use   Vaping Use: Never used  Substance Use Topics   Alcohol use: No   Drug use: No    Allergies:  Allergies  Allergen Reactions   Latex Hives    Medications Prior to Admission  Medication Sig Dispense Refill Last Dose   cyclobenzaprine (FLEXERIL) 5 MG tablet Take 1 tablet (5 mg total) by mouth 3 (three) times daily as needed for muscle spasms. 20 tablet 0    Prenatal Vit-Fe Fumarate-FA (PRENATAL MULTIVITAMIN) TABS tablet Take 1 tablet by mouth daily at 12 noon.       Review of Systems  Constitutional:  Positive for fatigue.  Gastrointestinal:  Positive for abdominal pain.       "Bubbling" as well as general lower abdominal pain  Musculoskeletal:  Positive for arthralgias. Negative for back pain, gait problem and joint swelling.  Neurological:  Negative for syncope.  All other systems reviewed and are negative. Physical Exam  Blood pressure 122/80, pulse 82, temperature 98 F (36.7 C), temperature source Oral, resp. rate 20, height 5\' 7"  (1.702 m), weight 101.3 kg, last menstrual period 05/16/2020, SpO2 99 %.  Physical Exam  MAU Course  Procedures  --Benign physical exam --Pertinent negatives: abdominal tenderness, vaginal bleeding, dysuria, fever.  --No episodes of vomiting during two hours of evaluation in MAU --Patient declines to provide urine sample at any time during evaluation in MAU. --Give absence of concerning findings on physical exam, will discharge home with plan to call if COVID/Flu swab has positive result. Will rx symptom management.  --Tingling in right leg: pulses normal, cap refill normal, ambulating independently with symmetrical joint and muscle  involvement. Discussed possibility of sleep position impacting sensation first thing in the morning.  Patient Vitals for the past 24 hrs:  BP Temp Temp src Pulse Resp SpO2 Height Weight  02/19/21 0808 122/80 98 F (36.7 C) Oral 82 20 99 % -- --  02/19/21 0801 -- -- -- -- -- -- 5\' 7"  (1.702 m) 101.3 kg   Results for orders placed or performed during the hospital encounter of 02/19/21 (from the past 24 hour(s))  Resp Panel by RT-PCR (Flu A&B, Covid) Nasopharyngeal Swab     Status: None   Collection Time: 02/19/21  8:22 AM   Specimen: Nasopharyngeal Swab; Nasopharyngeal(NP) swabs in vial transport medium  Result Value Ref Range   SARS Coronavirus 2 by RT PCR NEGATIVE NEGATIVE   Influenza A by PCR NEGATIVE NEGATIVE   Influenza B by PCR NEGATIVE NEGATIVE   Meds ordered this encounter  Medications   cyclobenzaprine (FLEXERIL) 5 MG tablet    Sig: Take 1 tablet (5 mg total) by mouth 3 (three) times daily as needed for muscle spasms.    Dispense:  20 tablet    Refill:  1    Order Specific Question:   Supervising Provider    Answer:   02/21/21 [[id number]]   acetaminophen (TYLENOL) 325 MG tablet    Sig: Take 2 tablets (650 mg total) by mouth every 4 (four) hours as needed for moderate pain.    Dispense:  240 tablet    Refill:  0    Order Specific Question:   Supervising Provider    Answer:   02/21/21 [[id number]]   promethazine (PHENERGAN) 25 MG tablet    Sig: Take 1 tablet (25 mg total) by mouth every 6 (six) hours as needed for nausea or vomiting.    Dispense:  30 tablet    Refill:  0    Order Specific Question:   Supervising Provider    Answer:   Adam Phenix [[id number]]   omeprazole (PRILOSEC OTC) 20 MG tablet    Sig: Take 1 tablet (20 mg total) by mouth daily.    Dispense:  30 tablet    Refill:  0    Order Specific Question:   Supervising Provider    Answer:   Adam Phenix [[id number]]   famotidine (PEPCID) 10 MG tablet    Sig: Take 1 tablet (10 mg total) by mouth 2 (two) times  daily.    Dispense:  60 tablet    Refill:  0    Order Specific Question:   Supervising Provider    Answer:   Adam Phenix [[id number]]    Assessment and Plan  --35 y.o. G2P1001 at [redacted]w[redacted]d  --FHT 144 by Doppler --No abnormal findings on physical exam --COVID and Flu negative --Will rx GERD treatment  --  No episodes of vomiting during MAU evaluation --Patient declined to provide urine specimen  --Discharge home in stable condition  Calvert Cantor, CNM 02/19/2021, 10:28 AM

## 2021-03-06 ENCOUNTER — Other Ambulatory Visit: Payer: Self-pay

## 2021-03-06 ENCOUNTER — Encounter (HOSPITAL_BASED_OUTPATIENT_CLINIC_OR_DEPARTMENT_OTHER): Payer: Self-pay

## 2021-03-06 ENCOUNTER — Inpatient Hospital Stay (HOSPITAL_BASED_OUTPATIENT_CLINIC_OR_DEPARTMENT_OTHER)
Admission: EM | Admit: 2021-03-06 | Discharge: 2021-03-06 | Disposition: A | Discharge status: Home / Self Care | Payer: PRIVATE HEALTH INSURANCE | Attending: Obstetrics and Gynecology | Admitting: Obstetrics and Gynecology

## 2021-03-06 DIAGNOSIS — B379 Candidiasis, unspecified: Secondary | ICD-10-CM | POA: Insufficient documentation

## 2021-03-06 DIAGNOSIS — B3731 Acute candidiasis of vulva and vagina: Secondary | ICD-10-CM | POA: Diagnosis not present

## 2021-03-06 DIAGNOSIS — O98812 Other maternal infectious and parasitic diseases complicating pregnancy, second trimester: Secondary | ICD-10-CM | POA: Diagnosis not present

## 2021-03-06 DIAGNOSIS — R1032 Left lower quadrant pain: Secondary | ICD-10-CM | POA: Diagnosis not present

## 2021-03-06 DIAGNOSIS — Z3A22 22 weeks gestation of pregnancy: Secondary | ICD-10-CM | POA: Diagnosis not present

## 2021-03-06 DIAGNOSIS — M545 Low back pain, unspecified: Secondary | ICD-10-CM | POA: Diagnosis present

## 2021-03-06 DIAGNOSIS — O26892 Other specified pregnancy related conditions, second trimester: Secondary | ICD-10-CM | POA: Diagnosis not present

## 2021-03-06 DIAGNOSIS — O99891 Other specified diseases and conditions complicating pregnancy: Secondary | ICD-10-CM

## 2021-03-06 LAB — URINALYSIS, ROUTINE W REFLEX MICROSCOPIC
Bilirubin Urine: NEGATIVE
Glucose, UA: NEGATIVE mg/dL
Hgb urine dipstick: NEGATIVE
Ketones, ur: NEGATIVE mg/dL
Nitrite: NEGATIVE
Protein, ur: NEGATIVE mg/dL
Specific Gravity, Urine: 1.01 (ref 1.005–1.030)
pH: 7.5 (ref 5.0–8.0)

## 2021-03-06 LAB — WET PREP, GENITAL
Clue Cells Wet Prep HPF POC: NONE SEEN
Sperm: NONE SEEN
Trich, Wet Prep: NONE SEEN
Yeast Wet Prep HPF POC: NONE SEEN

## 2021-03-06 LAB — POCT FERN TEST: POCT Fern Test: NEGATIVE

## 2021-03-06 LAB — URINALYSIS, MICROSCOPIC (REFLEX)

## 2021-03-06 LAB — AMNISURE RUPTURE OF MEMBRANE (ROM) NOT AT ARMC: Amnisure ROM: NEGATIVE

## 2021-03-06 MED ORDER — ACETAMINOPHEN 500 MG PO TABS
1000.0000 mg | ORAL_TABLET | Freq: Once | ORAL | Status: AC
  Administered 2021-03-06: 1000 mg via ORAL
  Filled 2021-03-06: qty 2

## 2021-03-06 MED ORDER — OXYCODONE HCL 5 MG PO TABS
5.0000 mg | ORAL_TABLET | Freq: Once | ORAL | Status: AC
  Administered 2021-03-06: 5 mg via ORAL
  Filled 2021-03-06: qty 1

## 2021-03-06 MED ORDER — CYCLOBENZAPRINE HCL 5 MG PO TABS: 10.0000 mg | ORAL_TABLET | Freq: Three times a day (TID) | ORAL | 1 refills | Status: AC | PRN

## 2021-03-06 MED ORDER — TERCONAZOLE 0.4 % VA CREA: 1.0000 | TOPICAL_CREAM | Freq: Every day | VAGINAL | 0 refills | Status: AC

## 2021-03-06 NOTE — ED Triage Notes (Addendum)
Pt arrives ambulatory to ED with c/o lower back pain X1 week, piercing pain to left side yesterday, and cramping pain early this morning. Pt is 22 wks 6 days. FHR during triage with doppler 140. Pt reports some leaking of fluids X3-4 days states she made OBGYN aware has follow up with them on Oct 25th. EDD2-9-23. Denies any vaginal bleeding. Pt also reports her partner cheated on her and states concern for STI.

## 2021-03-06 NOTE — ED Provider Notes (Signed)
[facility] EMERGENCY DEPARTMENT Provider Note   CSN: [id number] Arrival date & time: 03/06/21  0940     History Chief Complaint  Patient presents with   Back Pain    Maureen Miller is a 35 y.o. female.  35 yo F with a chief complaints of sensation of leaking of fluid and low back pain.  This is been going on for a couple days.  She is currently pregnant estimated 22 weeks and 6 days.  She is a gravida 2 para 1.  Had what sounds like decelerations during induction of labor with her last child and required a C-section otherwise denies any complications of her last pregnancy.  She denies vaginal bleeding.  Denies fevers.  The history is provided by the patient.  Back Pain Location:  Lumbar spine Quality:  Aching and cramping Radiates to:  Does not radiate Pain severity:  Moderate Onset quality:  Gradual Duration:  2 days Timing:  Constant Progression:  Worsening Chronicity:  New Relieved by:  Nothing Worsened by:  Nothing Ineffective treatments:  None tried Associated symptoms: no chest pain, no dysuria, no fever and no headaches       Past Medical History:  Diagnosis Date   Anemia    Anemia     There are no problems to display for this patient.   Past Surgical History:  Procedure Laterality Date   CESAREAN SECTION       OB History     Gravida  2   Para  1   Term  1   Preterm      AB      Living  1      SAB      IAB      Ectopic      Multiple      Live Births              No family history on file.  Social History   Tobacco Use   Smoking status: Never   Smokeless tobacco: Never  Vaping Use   Vaping Use: Never used  Substance Use Topics   Alcohol use: No   Drug use: No    Home Medications Prior to Admission medications   Medication Sig Start Date End Date Taking? Authorizing Provider  acetaminophen (TYLENOL) 325 MG tablet Take 2 tablets (650 mg total) by mouth every 4 (four) hours as needed for moderate pain.  02/19/21 03/21/21  Calvert Cantor, CNM  cyclobenzaprine (FLEXERIL) 5 MG tablet Take 1 tablet (5 mg total) by mouth 3 (three) times daily as needed for muscle spasms. 02/19/21   Calvert Cantor, CNM  famotidine (PEPCID) 10 MG tablet Take 1 tablet (10 mg total) by mouth 2 (two) times daily. 02/19/21 03/21/21  Calvert Cantor, CNM  omeprazole (PRILOSEC OTC) 20 MG tablet Take 1 tablet (20 mg total) by mouth daily. 02/19/21 03/21/21  Calvert Cantor, CNM  Prenatal Vit-Fe Fumarate-FA (PRENATAL MULTIVITAMIN) TABS tablet Take 1 tablet by mouth daily at 12 noon.    [provider]  promethazine (PHENERGAN) 25 MG tablet Take 1 tablet (25 mg total) by mouth every 6 (six) hours as needed for nausea or vomiting. 02/19/21   Calvert Cantor, CNM    Allergies    Latex  Review of Systems   Review of Systems  Constitutional:  Negative for chills and fever.  HENT:  Negative for congestion and rhinorrhea.   Eyes:  Negative for redness and visual disturbance.  Respiratory:  Negative for shortness of breath and wheezing.   Cardiovascular:  Negative for chest pain and palpitations.  Gastrointestinal:  Negative for nausea and vomiting.  Genitourinary:  Negative for dysuria and urgency.       Gush of fluids  Musculoskeletal:  Positive for back pain. Negative for arthralgias and myalgias.  Skin:  Negative for pallor and wound.  Neurological:  Negative for dizziness and headaches.   Physical Exam Updated Vital Signs BP 114/72 (BP Location: Right Arm)   Pulse 80   Temp 98.6 F (37 C) (Oral)   Resp 18   Ht 5\' 7"  (1.702 m)   Wt 103 kg   LMP 05/16/2020   SpO2 100%   BMI 35.55 kg/m   Physical Exam Vitals and nursing note reviewed.  Constitutional:      General: She is not in acute distress.    Appearance: She is well-developed. She is not diaphoretic.  HENT:     Head: Normocephalic and atraumatic.  Eyes:     Pupils: Pupils are equal, round, and reactive to light.   Cardiovascular:     Rate and Rhythm: Normal rate and regular rhythm.     Heart sounds: No murmur heard.   No friction rub. No gallop.  Pulmonary:     Effort: Pulmonary effort is normal.     Breath sounds: No wheezing or rales.  Abdominal:     General: There is no distension.     Palpations: Abdomen is soft.     Tenderness: There is no abdominal tenderness.  Musculoskeletal:        General: No tenderness.     Cervical back: Normal range of motion and neck supple.  Skin:    General: Skin is warm and dry.  Neurological:     Mental Status: She is alert and oriented to person, place, and time.  Psychiatric:        Behavior: Behavior normal.    ED Results / Procedures / Treatments   Labs (all labs ordered are listed, but only abnormal results are displayed) Labs Reviewed - No data to display  EKG None  Radiology No results found.  Procedures Procedures   Medications Ordered in ED Medications  acetaminophen (TYLENOL) tablet 1,000 mg (1,000 mg Oral Given 03/06/21 1036)  oxyCODONE (Oxy IR/ROXICODONE) immediate release tablet 5 mg (5 mg Oral Given 03/06/21 1036)    ED Course  I have reviewed the triage vital signs and the nursing notes.  Pertinent labs & imaging results that were available during my care of the patient were reviewed by me and considered in my medical decision making (see chart for details).    MDM Rules/Calculators/A&P                           35 yo F with with a chief complaint of a sensation of a gush of fluids and lower back pain when she is [redacted] weeks pregnant.  Denies any complications thus far in pregnancy.  Per protocol rapid OB was notified and recommended transferring to the MAU.  The patients results and plan were reviewed and discussed.   Any x-rays performed were independently reviewed by myself.   Differential diagnosis were considered with the presenting HPI.  Medications  acetaminophen (TYLENOL) tablet 1,000 mg (1,000 mg Oral Given  03/06/21 1036)  oxyCODONE (Oxy IR/ROXICODONE) immediate release tablet 5 mg (5 mg Oral Given 03/06/21 1036)    Vitals:   03/06/21 0957 03/06/21 05/06/21  03/06/21 1005  BP: 114/72    Pulse: 80    Resp: 18    Temp:   98.6 F (37 C)  TempSrc:   Oral  SpO2: 100%    Weight:  103 kg   Height:  5\' 7"  (1.702 m)     Final diagnoses:  Acute low back pain, unspecified back pain laterality, unspecified whether sciatica present    Final Clinical Impression(s) / ED Diagnoses Final diagnoses:  Acute low back pain, unspecified back pain laterality, unspecified whether sciatica present    Rx / DC Orders ED Discharge Orders     None        , DO 03/06/21 1059

## 2021-03-06 NOTE — ED Notes (Signed)
Rapid OB advises to transfer patient to MAU, ED MD Aware and speaking with patient

## 2021-03-06 NOTE — Progress Notes (Addendum)
RROB nurse informed that pt had presented to [facility] this morning complaining of back and lower left quadrant abdominal pain.  Pt is G2P1 at 22.[redacted]wks gestation.  She has a history of csection and plans to have a repeat.  She reports positive fetal movement and no vaginal bleeding.  However, she does say she has been leaking fluid x3-4 days.  Pt also reports concerns of STI because her partner recently cheated on her. FHT are in the 140s per med center and pt has been placed on toco. She receives [facility] and plans to deliver at [facility].

## 2021-03-06 NOTE — ED Notes (Signed)
OB Rapid made aware patient in department

## 2021-03-06 NOTE — ED Notes (Signed)
During triage pt reports running out of her muscle relaxers last week.

## 2021-03-06 NOTE — MAU Note (Signed)
...  Maureen Miller is a 35 y.o. at [redacted]w[redacted]d here in MAU reporting: leaking clear fluids for the past three days. The patient states it is very "thin." She states that around the same time she started having an "eggy" vaginal odor. Denies vaginal bleeding. She is endorsing sharp left lower abdominal pain that began yesterday and she states she only feels this pain when she urinates. She states that this morning at 0300 she began having intermittent lower abdominal cramping. She is also endorsing lower back pain for the past week that stretches across her entire lower back.   FHR: 150 doppler  Last doses: Tylenol at 1036 Oxycodone 1036 Prenatal Calcium, magnesium, and zinc: yesterday  Pain score:  7/10 lower back pain 8/10 lower abdomen

## 2021-03-06 NOTE — MAU Provider Note (Addendum)
History     CSN: [id number]  Arrival date and time: 03/06/21 0940   Event Date/Time   First Provider Initiated Contact with Patient 03/06/21 1236      Chief Complaint  Patient presents with   Back Pain   Rupture of Membranes   Maureen Miller is a 35yo female, G2P1001 at [redacted]w[redacted]d presenting for abdominal pain and leakage of fluid. Patient states she has had on/off cramping consistently through the pregnancy. She was woken from sleep this AM with stronger consistent abdominal pain. She has also noticed thin, clear fluid leaking for the past day. She denies vaginal bleeding. Also notes she experiences sharp pain in her LLQ with urination. She has had nausea/vomiting throughout her pregnancy. Denies contractions, headaches, fever, constipation or diarrhea.   Back Pain Associated symptoms include abdominal pain and dysuria. Pertinent negatives include no chest pain or fever.   OB History     Gravida  2   Para  1   Term  1   Preterm      AB      Living  1      SAB      IAB      Ectopic      Multiple      Live Births              Past Medical History:  Diagnosis Date   Anemia    Anemia     Past Surgical History:  Procedure Laterality Date   CESAREAN SECTION      History reviewed. No pertinent family history.  Social History   Tobacco Use   Smoking status: Never   Smokeless tobacco: Never  Vaping Use   Vaping Use: Never used  Substance Use Topics   Alcohol use: No   Drug use: No    Allergies:  Allergies  Allergen Reactions   Latex Hives    Medications Prior to Admission  Medication Sig Dispense Refill Last Dose   acetaminophen (TYLENOL) 325 MG tablet Take 2 tablets (650 mg total) by mouth every 4 (four) hours as needed for moderate pain. 240 tablet 0 03/06/2021 at 1036   Calcium-Magnesium-Zinc 333-133-5 MG TABS Take by mouth.   03/05/2021   famotidine (PEPCID) 10 MG tablet Take 1 tablet (10 mg total) by mouth 2 (two) times daily. 60 tablet  0 Past Week   Prenatal Vit-Fe Fumarate-FA (PRENATAL MULTIVITAMIN) TABS tablet Take 1 tablet by mouth daily at 12 noon.   03/05/2021   promethazine (PHENERGAN) 25 MG tablet Take 1 tablet (25 mg total) by mouth every 6 (six) hours as needed for nausea or vomiting. 30 tablet 0 Past Week   [DISCONTINUED] cyclobenzaprine (FLEXERIL) 5 MG tablet Take 1 tablet (5 mg total) by mouth 3 (three) times daily as needed for muscle spasms. 20 tablet 1 Past Month   omeprazole (PRILOSEC OTC) 20 MG tablet Take 1 tablet (20 mg total) by mouth daily. 30 tablet 0     Review of Systems  Constitutional:  Negative for fever.  HENT:  Negative for congestion and rhinorrhea.   Eyes:  Negative for visual disturbance.  Respiratory:  Negative for cough and shortness of breath.   Cardiovascular:  Negative for chest pain.  Gastrointestinal:  Positive for abdominal pain. Negative for constipation, diarrhea and vomiting.  Genitourinary:  Positive for dysuria and vaginal discharge. Negative for vaginal bleeding.  Musculoskeletal:  Positive for back pain.  Neurological:  Negative for dizziness and light-headedness.  Physical Exam   Blood  pressure 112/68, pulse 79, temperature 98.2 F (36.8 C), temperature source Oral, resp. rate 18, height 5\' 7"  (1.702 m), weight 103 kg, last menstrual period 05/16/2020, SpO2 100 %.  Physical Exam Vitals reviewed.  Constitutional:      Appearance: Normal appearance.  HENT:     Head: Normocephalic and atraumatic.     Nose: Nose normal.     Mouth/Throat:     Mouth: Mucous membranes are moist.  Eyes:     Extraocular Movements: Extraocular movements intact.  Cardiovascular:     Rate and Rhythm: Normal rate and regular rhythm.     Pulses: Normal pulses.  Pulmonary:     Effort: Pulmonary effort is normal.  Abdominal:     Palpations: Abdomen is soft.     Comments: Gravida, LLQ tenderness  Genitourinary:    Comments: Cervix closed. Thin white, milky discharge seen on speculum  exam. Skin:    General: Skin is warm and dry.  Neurological:     General: No focal deficit present.     Mental Status: She is alert and oriented to person, place, and time.  Psychiatric:        Mood and Affect: Mood normal.    MAU Course  Procedures  MDM Moderate  Fern test, urinalysis, wet prep, GC/chlamydia probe, Amnisure   Assessment and Plan  1. Abdominal Pain 2. Possible rupture of membranes, without rupture of membranes - Fern test negative. Urinalysis unremarkable. Wet prep negative for yeast, trich, and clue cells. Amnisure negative. White, milky discharge seen on pelvic exam suggestive of yeast infection. Pt will be treated for yeast with terconazole cream x7d. - Advised abdominal pain likely due to physiologic changes of pregnancy. Advised to follow up with normal OB as scheduled and inform them of any continuing pains. Flexeril refilled.  - Return precautions given with pt voicing understanding.   05/18/2020 03/06/2021, 2:47 PM     CNM attestation:  I have seen and examined this patient and agree with above documentation in the PA student's note.   Maureen Miller is a 35 y.o. G2P1001 at [redacted]w[redacted]d reporting vaginal discharge at 3 pm yesterday and this morning. It is clear, has kind of an "egg smell to it". She reports that her back pain is constant; it has "always been there". She frequently has vaginal discharge.  +FM, denies VB, contractions.  PE: Patient Vitals for the past 24 hrs:  BP Temp Temp src Pulse Resp SpO2 Height Weight  03/06/21 1436 112/68 -- -- 79 18 -- -- --  03/06/21 1151 111/68 -- -- 71 -- -- -- --  03/06/21 1135 110/70 98.2 F (36.8 C) Oral 64 20 100 % -- --  03/06/21 1005 -- 98.6 F (37 C) Oral -- -- -- -- --  03/06/21 0958 -- -- -- -- -- -- 5\' 7"  (1.702 m) 103 kg  03/06/21 0957 114/72 -- -- 80 18 100 % -- --   Gen: calm comfortable, NAD Resp: normal effort, no distress Heart: Regular rate Abd: Soft, NT, gravid, S=D   ROS, labs,  PMH reviewed  Orders Placed This Encounter  Procedures   Wet prep, genital   Urinalysis, Routine w reflex microscopic Urine, Clean Catch   Urinalysis, Microscopic (reflex)   Amnisure rupture of membrane (rom)not at [facility] Test   Discharge patient Discharge disposition: 01-Home or Self Care; Discharge patient date: 03/06/2021   Meds ordered this encounter  Medications   acetaminophen (TYLENOL) tablet 1,000 mg   oxyCODONE (Oxy  IR/ROXICODONE) immediate release tablet 5 mg   terconazole (TERAZOL 7) 0.4 % vaginal cream    Sig: Place 1 applicator vaginally at bedtime.    Dispense:  45 g    Refill:  0    Order Specific Question:   Supervising Provider    Answer:   Reva Bores [[id number]]   cyclobenzaprine (FLEXERIL) 5 MG tablet    Sig: Take 2 tablets (10 mg total) by mouth 3 (three) times daily as needed for muscle spasms.    Dispense:  20 tablet    Refill:  1    Order Specific Question:   Supervising Provider    Answer:   Samara Snide    MDM On exam, no pooling in the vagina; discharge consistent with yeast. Amnisure and fern are negative.   Assessment: 1. Acute low back pain, unspecified back pain laterality, unspecified whether sciatica present   2. Yeast infection     Plan: - Discharge home in stable condition. -Reviewed normal discharge in pregnancy; patient verbalized understanding of signs of ROM and when to return to MAU.  - Follow-up as scheduled at your doctor's office for next prenatal visit or sooner as needed if symptoms worsen. She plans to keep appt at [facility] on the 25th.  - Return to maternity admissions symptoms worsen -RX for flexeril and terazole cream given.  Marylene Land, CNM 03/06/2021 2:53 PM

## 2021-03-06 NOTE — ED Notes (Signed)
[facility] at bedside, pt belongings taken with patient include, car keys, Blue backpack purse, blue crocs, black T-Shirt, and jean jacket. Pt wearing black leggings.

## 2021-03-06 NOTE — Progress Notes (Signed)
Dr Alvester Morin called and notified about patient.  Due to patient complaint of LOF, she will have to be evaluated in MAU.  [facility] called and they will have pt transported.  RROB will notify MAU.

## 2021-03-07 LAB — GC/CHLAMYDIA PROBE AMP (~~LOC~~) NOT AT ARMC
Chlamydia: NEGATIVE
Comment: NEGATIVE
Comment: NORMAL
Neisseria Gonorrhea: NEGATIVE

## 2021-03-16 ENCOUNTER — Other Ambulatory Visit: Payer: Self-pay

## 2021-03-16 ENCOUNTER — Encounter (HOSPITAL_COMMUNITY): Payer: Self-pay | Admitting: Emergency Medicine

## 2021-03-16 ENCOUNTER — Observation Stay (HOSPITAL_COMMUNITY)
Admission: EM | Admit: 2021-03-16 | Discharge: 2021-03-17 | Disposition: A | Discharge status: Home / Self Care | Payer: PRIVATE HEALTH INSURANCE | Attending: Obstetrics & Gynecology | Admitting: Obstetrics & Gynecology

## 2021-03-16 ENCOUNTER — Observation Stay (HOSPITAL_COMMUNITY): Payer: PRIVATE HEALTH INSURANCE

## 2021-03-16 DIAGNOSIS — Z3493 Encounter for supervision of normal pregnancy, unspecified, third trimester: Secondary | ICD-10-CM

## 2021-03-16 DIAGNOSIS — Z91040 Latex allergy status: Secondary | ICD-10-CM | POA: Insufficient documentation

## 2021-03-16 DIAGNOSIS — Z3A24 24 weeks gestation of pregnancy: Secondary | ICD-10-CM | POA: Insufficient documentation

## 2021-03-16 DIAGNOSIS — S0990XA Unspecified injury of head, initial encounter: Secondary | ICD-10-CM | POA: Diagnosis not present

## 2021-03-16 DIAGNOSIS — R1032 Left lower quadrant pain: Secondary | ICD-10-CM | POA: Diagnosis not present

## 2021-03-16 DIAGNOSIS — O9A212 Injury, poisoning and certain other consequences of external causes complicating pregnancy, second trimester: Principal | ICD-10-CM | POA: Insufficient documentation

## 2021-03-16 DIAGNOSIS — Z20822 Contact with and (suspected) exposure to covid-19: Secondary | ICD-10-CM | POA: Insufficient documentation

## 2021-03-16 DIAGNOSIS — O26892 Other specified pregnancy related conditions, second trimester: Secondary | ICD-10-CM | POA: Insufficient documentation

## 2021-03-16 HISTORY — DX: Anemia, unspecified: D64.9

## 2021-03-16 LAB — CBC WITH DIFFERENTIAL/PLATELET
Abs Immature Granulocytes: 0.02 10*3/uL (ref 0.00–0.07)
Basophils Absolute: 0 10*3/uL (ref 0.0–0.1)
Basophils Relative: 0 %
Eosinophils Absolute: 0.2 10*3/uL (ref 0.0–0.5)
Eosinophils Relative: 2 %
HCT: 35.2 % — ABNORMAL LOW (ref 36.0–46.0)
Hemoglobin: 11.5 g/dL — ABNORMAL LOW (ref 12.0–15.0)
Immature Granulocytes: 0 %
Lymphocytes Relative: 27 %
Lymphs Abs: 2.3 10*3/uL (ref 0.7–4.0)
MCH: 27.1 pg (ref 26.0–34.0)
MCHC: 32.7 g/dL (ref 30.0–36.0)
MCV: 82.8 fL (ref 80.0–100.0)
Monocytes Absolute: 0.5 10*3/uL (ref 0.1–1.0)
Monocytes Relative: 6 %
Neutro Abs: 5.3 10*3/uL (ref 1.7–7.7)
Neutrophils Relative %: 65 %
Platelets: 288 10*3/uL (ref 150–400)
RBC: 4.25 MIL/uL (ref 3.87–5.11)
RDW: 13.5 % (ref 11.5–15.5)
WBC: 8.3 10*3/uL (ref 4.0–10.5)
nRBC: 0 % (ref 0.0–0.2)

## 2021-03-16 LAB — COMPREHENSIVE METABOLIC PANEL
ALT: 8 U/L (ref 0–44)
AST: 13 U/L — ABNORMAL LOW (ref 15–41)
Albumin: 2.8 g/dL — ABNORMAL LOW (ref 3.5–5.0)
Alkaline Phosphatase: 48 U/L (ref 38–126)
Anion gap: 5 (ref 5–15)
BUN: 5 mg/dL — ABNORMAL LOW (ref 6–20)
CO2: 22 mmol/L (ref 22–32)
Calcium: 8.8 mg/dL — ABNORMAL LOW (ref 8.9–10.3)
Chloride: 108 mmol/L (ref 98–111)
Creatinine, Ser: 0.57 mg/dL (ref 0.44–1.00)
GFR, Estimated: >60 mL/min (ref >60)
Glucose, Bld: 105 mg/dL — ABNORMAL HIGH (ref 70–99)
Potassium: 3.7 mmol/L (ref 3.5–5.1)
Sodium: 135 mmol/L (ref 135–145)
Total Bilirubin: 0.3 mg/dL (ref 0.3–1.2)
Total Protein: 6.1 g/dL — ABNORMAL LOW (ref 6.5–8.1)

## 2021-03-16 LAB — TYPE AND SCREEN
ABO/RH(D): O POS
Antibody Screen: NEGATIVE

## 2021-03-16 MED ORDER — LACTATED RINGERS IV SOLN: INTRAVENOUS | Status: DC

## 2021-03-16 MED ORDER — LACTATED RINGERS IV BOLUS
1000.0000 mL | Freq: Once | INTRAVENOUS | Status: AC
  Administered 2021-03-16: 1000 mL via INTRAVENOUS

## 2021-03-16 MED ORDER — DOCUSATE SODIUM 100 MG PO CAPS
100.0000 mg | ORAL_CAPSULE | Freq: Every day | ORAL | Status: DC
  Administered 2021-03-17: 100 mg via ORAL
  Filled 2021-03-16: qty 1

## 2021-03-16 MED ORDER — CALCIUM CARBONATE ANTACID 500 MG PO CHEW: 2.0000 | CHEWABLE_TABLET | ORAL | Status: DC | PRN

## 2021-03-16 MED ORDER — PRENATAL MULTIVITAMIN CH: 1.0000 | ORAL_TABLET | Freq: Every day | ORAL | Status: DC

## 2021-03-16 MED ORDER — ACETAMINOPHEN 325 MG PO TABS
650.0000 mg | ORAL_TABLET | ORAL | Status: DC | PRN
  Administered 2021-03-17: 650 mg via ORAL
  Filled 2021-03-16: qty 2

## 2021-03-16 MED ORDER — ZOLPIDEM TARTRATE 5 MG PO TABS: 5.0000 mg | ORAL_TABLET | Freq: Every evening | ORAL | Status: DC | PRN

## 2021-03-16 MED ORDER — ACETAMINOPHEN 325 MG PO TABS
650.0000 mg | ORAL_TABLET | Freq: Once | ORAL | Status: AC
  Administered 2021-03-16: 650 mg via ORAL
  Filled 2021-03-16: qty 2

## 2021-03-16 MED ORDER — CYCLOBENZAPRINE HCL 10 MG PO TABS
5.0000 mg | ORAL_TABLET | Freq: Three times a day (TID) | ORAL | Status: DC | PRN
  Administered 2021-03-16 – 2021-03-17 (×2): 5 mg via ORAL
  Filled 2021-03-16 (×2): qty 1
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
## 2021-03-16 NOTE — Plan of Care (Signed)
  Problem: Education: Goal: Knowledge of General Education information will improve Description: Including pain rating scale, medication(s)/side effects and non-pharmacologic comfort measures Outcome: Completed/Met
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
## 2021-03-16 NOTE — ED Notes (Signed)
Yao MD using ultrasound at bedside to check fetus, no abnormal findings.

## 2021-03-17 DIAGNOSIS — O9A212 Injury, poisoning and certain other consequences of external causes complicating pregnancy, second trimester: Secondary | ICD-10-CM | POA: Diagnosis not present

## 2021-03-17 LAB — RESP PANEL BY RT-PCR (FLU A&B, COVID) ARPGX2
Influenza A by PCR: NEGATIVE
Influenza B by PCR: NEGATIVE
SARS Coronavirus 2 by RT PCR: NEGATIVE

## 2021-03-17 LAB — CBC
HCT: 31.7 % — ABNORMAL LOW (ref 36.0–46.0)
Hemoglobin: 10.7 g/dL — ABNORMAL LOW (ref 12.0–15.0)
MCH: 27.6 pg (ref 26.0–34.0)
MCHC: 33.8 g/dL (ref 30.0–36.0)
MCV: 81.7 fL (ref 80.0–100.0)
Platelets: 243 10*3/uL (ref 150–400)
RBC: 3.88 MIL/uL (ref 3.87–5.11)
RDW: 13.5 % (ref 11.5–15.5)
WBC: 7.2 10*3/uL (ref 4.0–10.5)
nRBC: 0 % (ref 0.0–0.2)
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
## 2021-03-18 ENCOUNTER — Encounter (HOSPITAL_BASED_OUTPATIENT_CLINIC_OR_DEPARTMENT_OTHER): Payer: Self-pay

## 2021-12-04 IMAGING — CR DG CHEST 2V
2 series · 2 of 2 positions shown · non-contrast
Comparison: March 31, 2015

CLINICAL DATA: Chest pain

EXAM:
CHEST - 2 VIEW

[w chest pa]
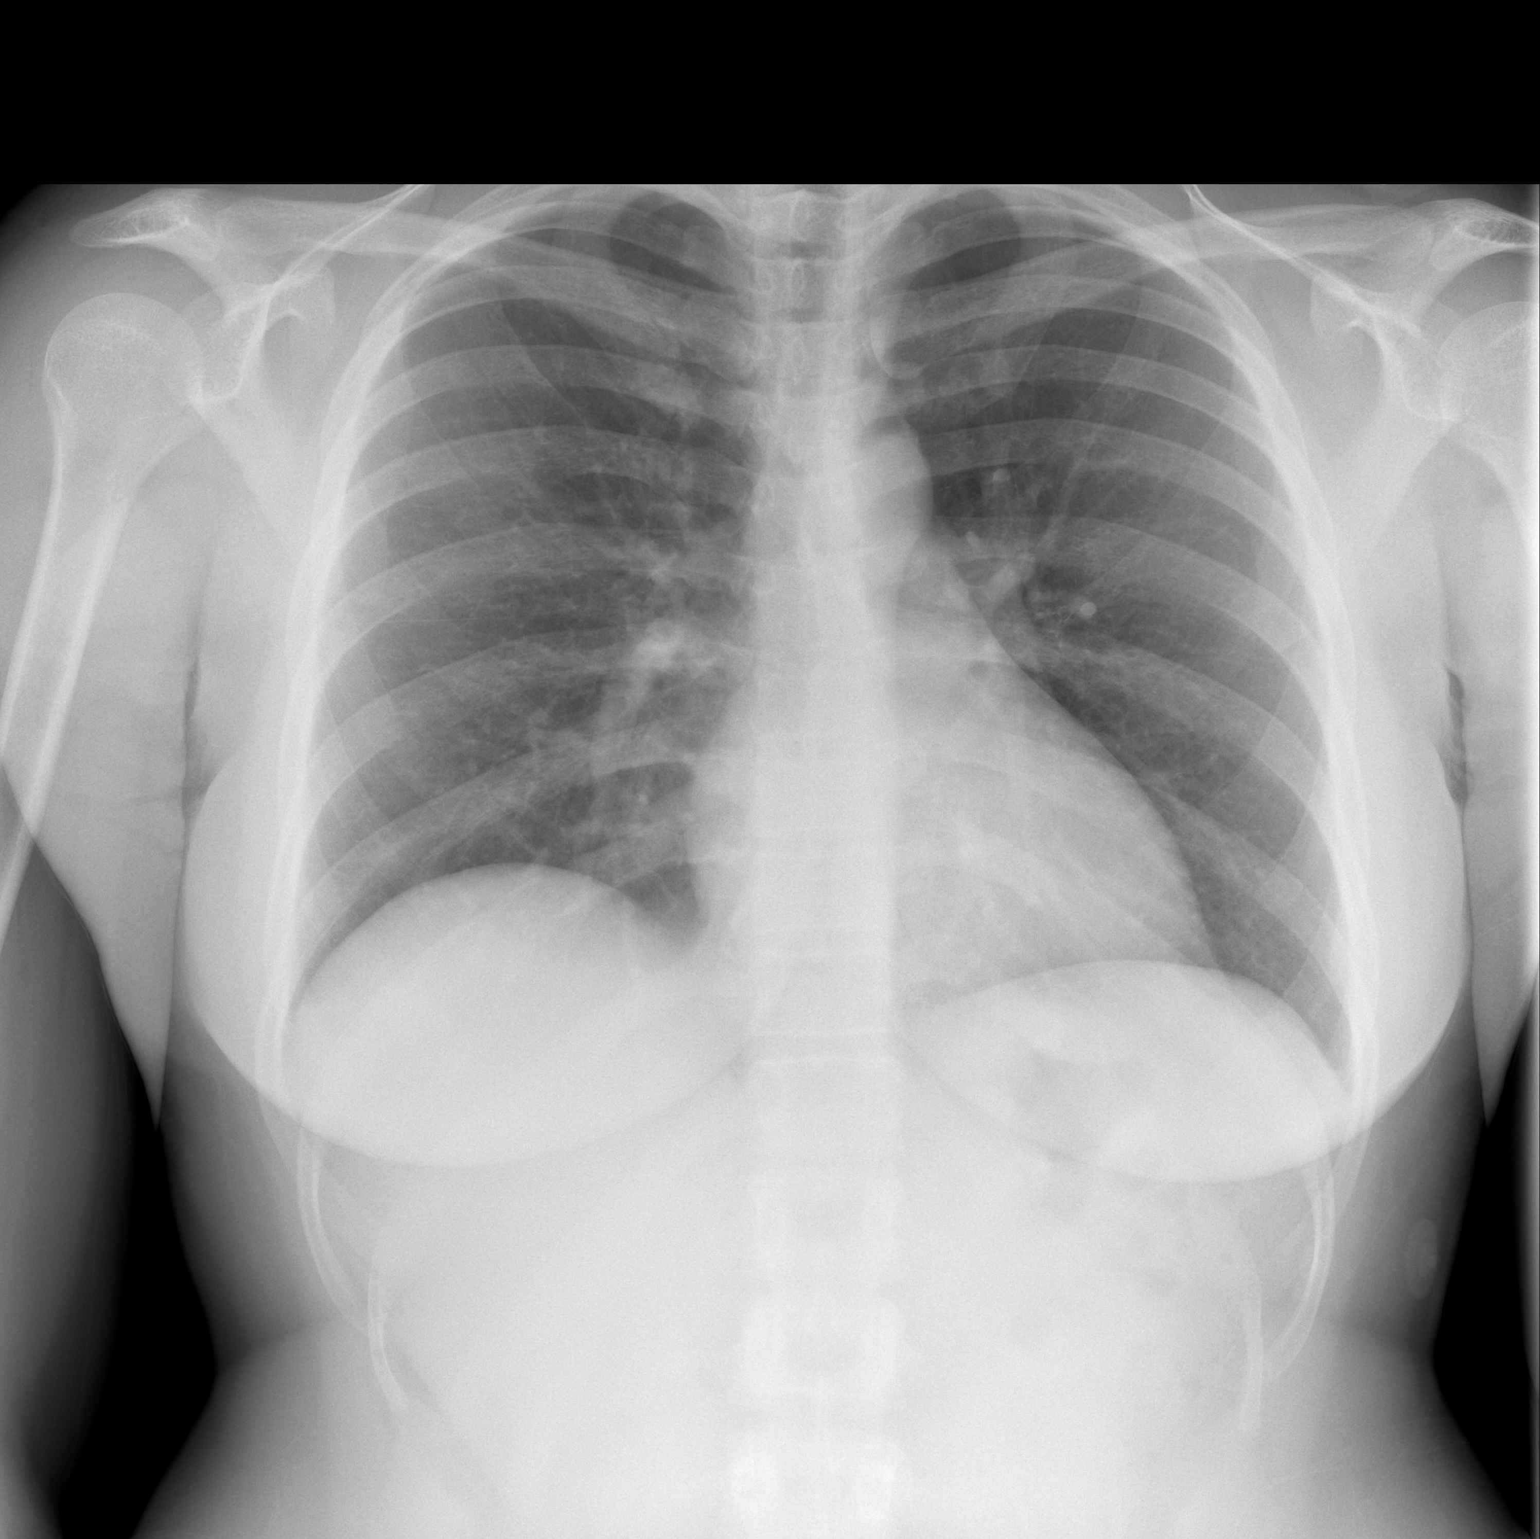

[w chest lat]
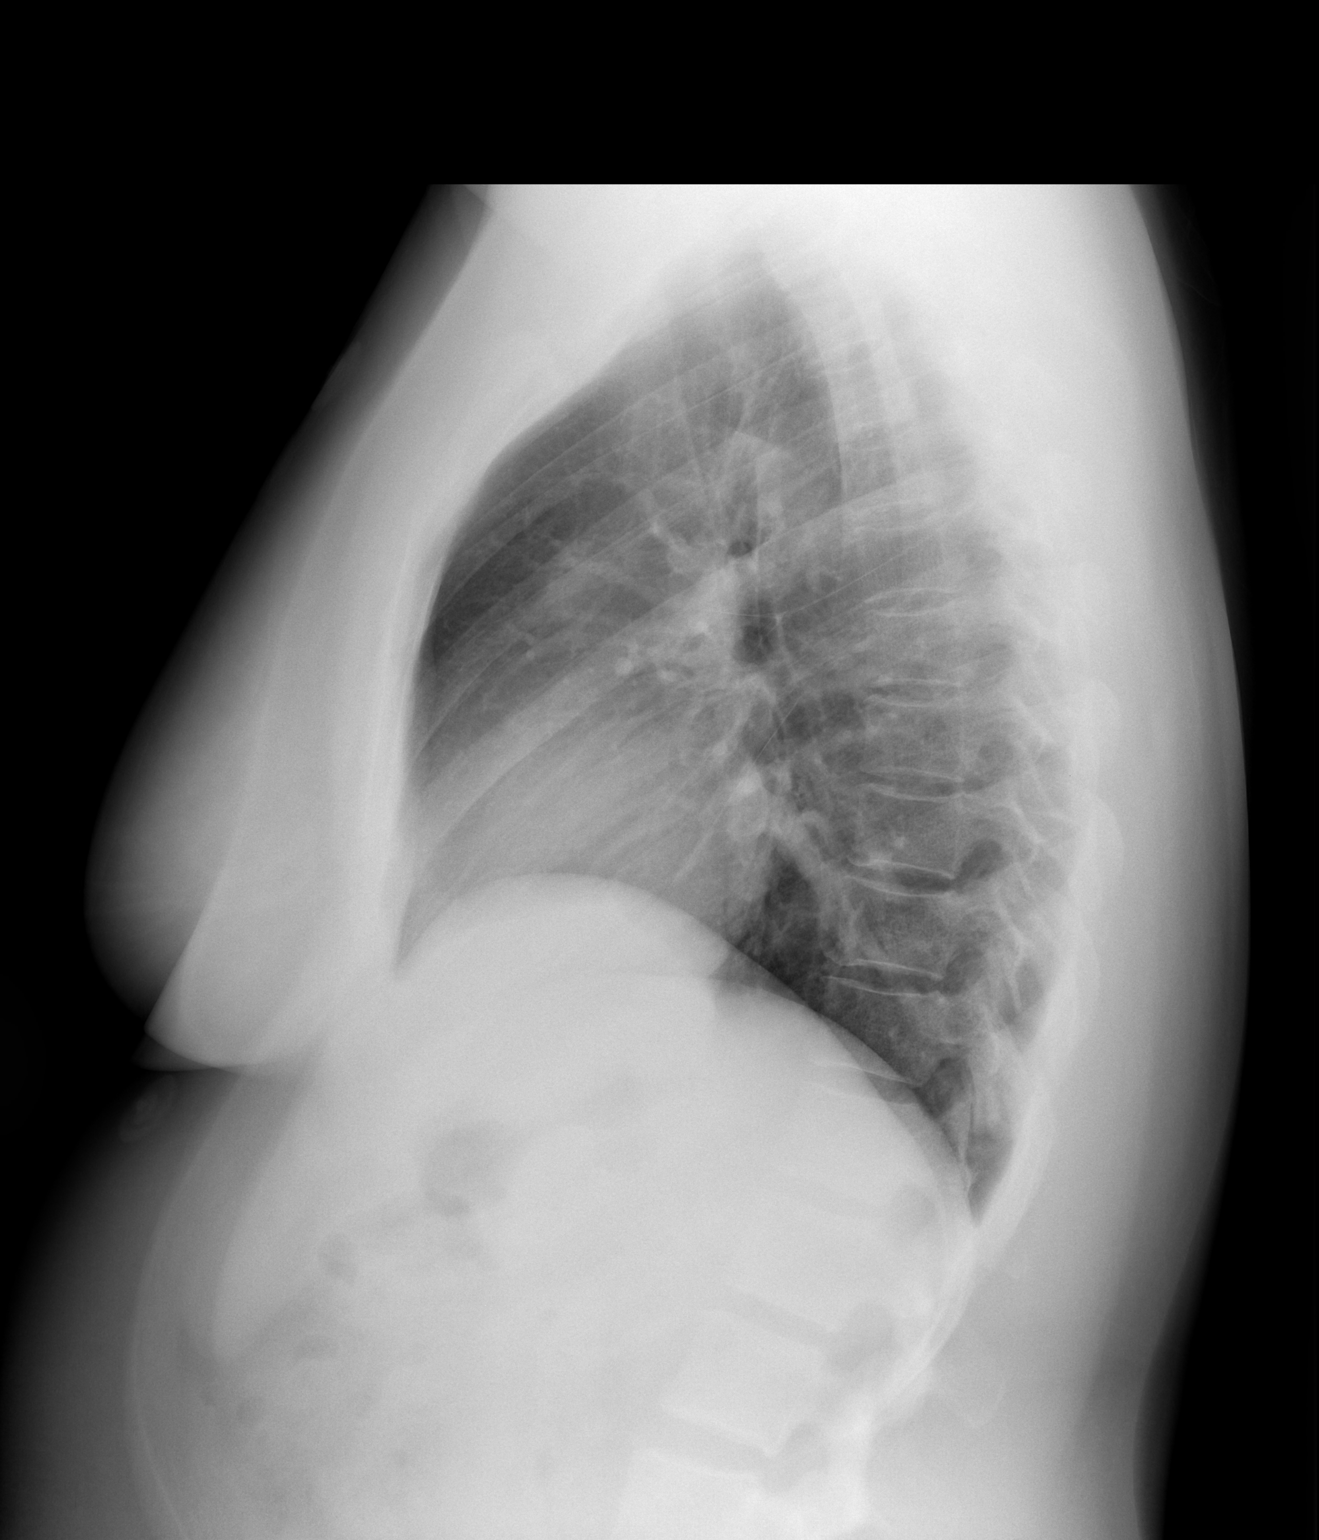

[2 of 2 positions shown; findings below may reference images not displayed]

FINDINGS: The lungs are clear. The heart size and pulmonary vascularity are
normal. No adenopathy. No pneumothorax. No bone lesions.
IMPRESSION: No abnormality noted.

## 2022-10-07 ENCOUNTER — Other Ambulatory Visit: Payer: Self-pay

## 2022-10-07 ENCOUNTER — Emergency Department (HOSPITAL_BASED_OUTPATIENT_CLINIC_OR_DEPARTMENT_OTHER)
Admission: EM | Admit: 2022-10-07 | Discharge: 2022-10-07 | Disposition: A | Discharge status: Home / Self Care | Payer: Medicaid Other | Attending: Emergency Medicine | Admitting: Emergency Medicine

## 2022-10-07 ENCOUNTER — Encounter (HOSPITAL_BASED_OUTPATIENT_CLINIC_OR_DEPARTMENT_OTHER): Payer: Self-pay

## 2022-10-07 DIAGNOSIS — S62619A Displaced fracture of proximal phalanx of unspecified finger, initial encounter for closed fracture: Secondary | ICD-10-CM

## 2022-10-07 DIAGNOSIS — W19XXXA Unspecified fall, initial encounter: Secondary | ICD-10-CM | POA: Diagnosis not present

## 2022-10-07 DIAGNOSIS — S62616A Displaced fracture of proximal phalanx of right little finger, initial encounter for closed fracture: Secondary | ICD-10-CM | POA: Insufficient documentation

## 2022-10-07 MED ORDER — HYDROCODONE-ACETAMINOPHEN 5-325 MG PO TABS: 1.0000 | ORAL_TABLET | Freq: Four times a day (QID) | ORAL | 0 refills | Status: AC | PRN

## 2022-10-07 NOTE — ED Provider Notes (Signed)
[facility] Provider Note: Lowella Dell, MD, FACEP  CSN: [id number] MRN: [id number] ARRIVAL: 10/07/22 at 0221 ROOM: MH06/MH06   CHIEF COMPLAINT  Hand Injury   HISTORY OF PRESENT ILLNESS  10/07/22 3:19 AM Maureen Miller is a 37 y.o. female who fell at work 2 days ago and fractured the base of her right fifth proximal phalanx.  She was seen at [facility] where she was prescribed ibuprofen 800 mg (20 tablets) and hydrocodone/APAP 5/325 (8 tablets).  She states they were sent to the wrong pharmacy and she has not been able to get them.  She is having pain in her right hand which she rates as a 10 out of 10.    Past Medical History:  Diagnosis Date   Anemia     Past Surgical History:  Procedure Laterality Date   CESAREAN SECTION      History reviewed. No pertinent family history.  Social History   Tobacco Use   Smoking status: Never   Smokeless tobacco: Never  Vaping Use   Vaping Use: Never used  Substance Use Topics   Alcohol use: Not Currently   Drug use: Never    Prior to Admission medications   Medication Sig Start Date End Date Taking? Authorizing Provider  HYDROcodone-acetaminophen (NORCO) 5-325 MG tablet Take 1 tablet by mouth every 6 (six) hours as needed for severe pain. 10/07/22  Yes Lei Dower, MD  Calcium-Magnesium-Zinc [phone] MG TABS Take by mouth.    [provider]  cyclobenzaprine (FLEXERIL) 5 MG tablet Take 2 tablets (10 mg total) by mouth 3 (three) times daily as needed for muscle spasms. 03/06/21   Marylene Land, CNM  famotidine (PEPCID) 10 MG tablet Take 1 tablet (10 mg total) by mouth 2 (two) times daily. 02/19/21 03/21/21  Calvert Cantor, CNM  omeprazole (PRILOSEC OTC) 20 MG tablet Take 1 tablet (20 mg total) by mouth daily. 02/19/21 03/21/21  Calvert Cantor, CNM  Prenatal Vit-Fe Fumarate-FA (PRENATAL MULTIVITAMIN) TABS tablet Take 1 tablet by mouth daily at 12 noon.    [provider]  promethazine (PHENERGAN) 25 MG tablet Take 1 tablet (25 mg total) by mouth every 6 (six) hours as needed for nausea or vomiting. 02/19/21   Calvert Cantor, CNM  terconazole (TERAZOL 7) 0.4 % vaginal cream Place 1 applicator vaginally at bedtime. 03/06/21   Marylene Land, CNM    Allergies Latex   REVIEW OF SYSTEMS  Negative except as noted here or in the History of Present Illness.   PHYSICAL EXAMINATION  Initial Vital Signs Blood pressure 131/85, pulse 85, temperature 98.2 F (36.8 C), temperature source Oral, resp. rate 18, height 5\' 7"  (1.702 m), weight 102.1 kg, last menstrual period 09/03/2022, SpO2 100 %, unknown if currently breastfeeding.  Examination General: Well-developed, well-nourished female in no acute distress; appearance consistent with age of record HENT: normocephalic; atraumatic Eyes: Normal appearance Neck: supple Heart: regular rate and rhythm Lungs: clear to auscultation bilaterally Abdomen: soft; nondistended; nontender; bowel sounds present Extremities: No deformity; right hand in splint, decreased sensation at tip of right fifth finger:    Neurologic: Awake, alert and oriented; motor function intact in all extremities and symmetric; no facial droop Skin: Warm and dry Psychiatric: Normal mood and affect   RESULTS  Summary of this visit's results, reviewed and interpreted by myself:   EKG Interpretation  Date/Time:    Ventricular Rate:    PR Interval:    QRS Duration:  QT Interval:    QTC Calculation:   R Axis:     Text Interpretation:         Laboratory Studies: No results found for this or any previous visit (from the past 24 hour(s)). Imaging Studies: No results found.  ED COURSE and MDM  Nursing notes, initial and subsequent vitals signs, including pulse oximetry, reviewed and interpreted by myself.  Vitals:   10/07/22 0247 10/07/22 0251  BP:  131/85  Pulse:  85  Resp:  18  Temp:  98.2 F  (36.8 C)  TempSrc:  Oral  SpO2:  100%  Weight: 102.1 kg   Height: 5\' 7"  (1.702 m)    Medications - No data to display  Will resend prescription to patient's preferred local pharmacy.  A review of PMP[place] shows no red flags that would suggest prescription drug abuse.  PROCEDURES  Procedures   ED DIAGNOSES     ICD-10-CM   1. Closed fracture of base of proximal phalanx of finger  S62.619A          Meldrick Buttery, MD 10/07/22 [phone]

## 2022-10-07 NOTE — ED Triage Notes (Signed)
Pt fell on Sunday at work and was seen at [facility] - has a fracture and not relieved by Tylenol /Advil.  Pt states [facility] prescribed pain meds but sent to incorrect Pharmacy so has not had any Norco yet.

## 2022-11-13 ENCOUNTER — Other Ambulatory Visit: Payer: Self-pay | Admitting: Orthopedic Surgery

## 2022-11-13 DIAGNOSIS — M25562 Pain in left knee: Secondary | ICD-10-CM

## 2022-11-20 ENCOUNTER — Other Ambulatory Visit: Payer: Medicaid Other

## 2022-11-23 ENCOUNTER — Ambulatory Visit
Admission: RE | Admit: 2022-11-23 | Discharge: 2022-11-23 | Discharge status: Home / Self Care | Payer: Medicaid Other | Source: Ambulatory Visit | Attending: Orthopedic Surgery | Admitting: Orthopedic Surgery

## 2022-11-23 DIAGNOSIS — M25562 Pain in left knee: Secondary | ICD-10-CM

## 2023-10-11 ENCOUNTER — Other Ambulatory Visit: Payer: Self-pay

## 2023-10-11 ENCOUNTER — Encounter (HOSPITAL_BASED_OUTPATIENT_CLINIC_OR_DEPARTMENT_OTHER): Payer: Self-pay | Admitting: Emergency Medicine

## 2023-10-11 ENCOUNTER — Emergency Department (HOSPITAL_BASED_OUTPATIENT_CLINIC_OR_DEPARTMENT_OTHER)

## 2023-10-11 ENCOUNTER — Emergency Department (HOSPITAL_BASED_OUTPATIENT_CLINIC_OR_DEPARTMENT_OTHER)
Admission: EM | Admit: 2023-10-11 | Discharge: 2023-10-12 | Disposition: A | Discharge status: Home / Self Care | Attending: Emergency Medicine | Admitting: Emergency Medicine

## 2023-10-11 DIAGNOSIS — R42 Dizziness and giddiness: Secondary | ICD-10-CM | POA: Diagnosis present

## 2023-10-11 DIAGNOSIS — R197 Diarrhea, unspecified: Secondary | ICD-10-CM | POA: Diagnosis not present

## 2023-10-11 DIAGNOSIS — R079 Chest pain, unspecified: Secondary | ICD-10-CM | POA: Insufficient documentation

## 2023-10-11 DIAGNOSIS — Z91040 Latex allergy status: Secondary | ICD-10-CM | POA: Diagnosis not present

## 2023-10-11 DIAGNOSIS — R1110 Vomiting, unspecified: Secondary | ICD-10-CM | POA: Insufficient documentation

## 2023-10-11 DIAGNOSIS — D649 Anemia, unspecified: Secondary | ICD-10-CM | POA: Diagnosis not present

## 2023-10-11 DIAGNOSIS — R0602 Shortness of breath: Secondary | ICD-10-CM | POA: Diagnosis not present

## 2023-10-11 DIAGNOSIS — R531 Weakness: Secondary | ICD-10-CM | POA: Diagnosis not present

## 2023-10-11 LAB — COMPREHENSIVE METABOLIC PANEL WITH GFR
ALT: 9 U/L (ref 0–44)
AST: 14 U/L — ABNORMAL LOW (ref 15–41)
Albumin: 4 g/dL (ref 3.5–5.0)
Alkaline Phosphatase: 57 U/L (ref 38–126)
Anion gap: 9 (ref 5–15)
BUN: 13 mg/dL (ref 6–20)
CO2: 25 mmol/L (ref 22–32)
Calcium: 8.6 mg/dL — ABNORMAL LOW (ref 8.9–10.3)
Chloride: 103 mmol/L (ref 98–111)
Creatinine, Ser: 0.75 mg/dL (ref 0.44–1.00)
GFR, Estimated: >60 mL/min (ref >60)
Glucose, Bld: 100 mg/dL — ABNORMAL HIGH (ref 70–99)
Potassium: 4 mmol/L (ref 3.5–5.1)
Sodium: 138 mmol/L (ref 135–145)
Total Bilirubin: 0.2 mg/dL (ref 0.0–1.2)
Total Protein: 6.7 g/dL (ref 6.5–8.1)

## 2023-10-11 LAB — CBC WITH DIFFERENTIAL/PLATELET
Abs Immature Granulocytes: 0.02 10*3/uL (ref 0.00–0.07)
Basophils Absolute: 0 10*3/uL (ref 0.0–0.1)
Basophils Relative: 1 %
Eosinophils Absolute: 0.2 10*3/uL (ref 0.0–0.5)
Eosinophils Relative: 2 %
HCT: 34.2 % — ABNORMAL LOW (ref 36.0–46.0)
Hemoglobin: 11 g/dL — ABNORMAL LOW (ref 12.0–15.0)
Immature Granulocytes: 0 %
Lymphocytes Relative: 35 %
Lymphs Abs: 3 10*3/uL (ref 0.7–4.0)
MCH: 25.9 pg — ABNORMAL LOW (ref 26.0–34.0)
MCHC: 32.2 g/dL (ref 30.0–36.0)
MCV: 80.7 fL (ref 80.0–100.0)
Monocytes Absolute: 0.6 10*3/uL (ref 0.1–1.0)
Monocytes Relative: 7 %
Neutro Abs: 4.8 10*3/uL (ref 1.7–7.7)
Neutrophils Relative %: 55 %
Platelets: 316 10*3/uL (ref 150–400)
RBC: 4.24 MIL/uL (ref 3.87–5.11)
RDW: 13.9 % (ref 11.5–15.5)
WBC: 8.7 10*3/uL (ref 4.0–10.5)
nRBC: 0 % (ref 0.0–0.2)

## 2023-10-11 LAB — RESP PANEL BY RT-PCR (RSV, FLU A&B, COVID)  RVPGX2
Influenza A by PCR: NEGATIVE
Influenza B by PCR: NEGATIVE
Resp Syncytial Virus by PCR: NEGATIVE
SARS Coronavirus 2 by RT PCR: NEGATIVE

## 2023-10-11 LAB — LIPASE, BLOOD: Lipase: 27 U/L (ref 11–51)

## 2023-10-11 MED ORDER — SODIUM CHLORIDE 0.9 % IV BOLUS
1000.0000 mL | Freq: Once | INTRAVENOUS | Status: AC
  Administered 2023-10-11: 1000 mL via INTRAVENOUS

## 2023-10-11 MED ORDER — ONDANSETRON HCL 4 MG/2ML IJ SOLN
4.0000 mg | Freq: Once | INTRAMUSCULAR | Status: AC
  Administered 2023-10-11: 4 mg via INTRAVENOUS
  Filled 2023-10-11: qty 2

## 2023-10-11 NOTE — ED Notes (Signed)
Pt. Aware urine specimen needed. 

## 2023-10-11 NOTE — ED Provider Notes (Signed)
 [facility] AT [facility] Provider Note   CSN: [id number] Arrival date & time: 10/11/23  2127     History  Chief Complaint  Patient presents with   Dizziness    Maureen Miller is a 38 y.o. female.  Patient to ED with symptoms that started 2 days ago with generalized weakness and dizziness. No syncope, chest pain, SOB. She reports vomiting and diarrhea since yesterday, with 3 episodes today of each emesis and diarrhea, both without blood. No fever. She has been unable to eat or drink anything since yesterday and dizziness is worse.   The history is provided by the patient. No language interpreter was used.  Dizziness      Home Medications Prior to Admission medications   Medication Sig Start Date End Date Taking? Authorizing Provider  Calcium -Magnesium -Zinc 333-133-5 MG TABS Take by mouth.    [provider]  cyclobenzaprine  (FLEXERIL ) 5 MG tablet Take 2 tablets (10 mg total) by mouth 3 (three) times daily as needed for muscle spasms. 03/06/21   Kooistra, Kathryn Lorraine, CNM  famotidine  (PEPCID ) 10 MG tablet Take 1 tablet (10 mg total) by mouth 2 (two) times daily. 02/19/21 03/21/21  Weinhold, Samantha C, CNM  HYDROcodone -acetaminophen  (NORCO) 5-325 MG tablet Take 1 tablet by mouth every 6 (six) hours as needed for severe pain. 10/07/22   Molpus, John, MD  omeprazole  (PRILOSEC  OTC) 20 MG tablet Take 1 tablet (20 mg total) by mouth daily. 02/19/21 03/21/21  Weinhold, Samantha C, CNM  Prenatal Vit-Fe Fumarate-FA (PRENATAL MULTIVITAMIN) TABS tablet Take 1 tablet by mouth daily at 12 noon.    [provider]  promethazine  (PHENERGAN ) 25 MG tablet Take 1 tablet (25 mg total) by mouth every 6 (six) hours as needed for nausea or vomiting. 02/19/21   Weinhold, Samantha C, CNM  terconazole  (TERAZOL 7 ) 0.4 % vaginal cream Place 1 applicator vaginally at bedtime. 03/06/21   Kooistra, Kathryn Lorraine, CNM      Allergies    Latex    Review  of Systems   Review of Systems  Neurological:  Positive for dizziness.    Physical Exam Updated Vital Signs BP 122/76 (BP Location: Right Arm)   Pulse 72   Temp (!) 97.5 F (36.4 C)   Resp 18   Ht 5\' 7"  (1.702 m)   Wt 102.1 kg   LMP 09/27/2023   SpO2 100%   Breastfeeding No   BMI 35.24 kg/m  Physical Exam Constitutional:      Appearance: She is well-developed.  HENT:     Head: Normocephalic.  Eyes:     Comments: No conjunctival pallor.  Cardiovascular:     Rate and Rhythm: Normal rate and regular rhythm.     Heart sounds: No murmur heard. Pulmonary:     Effort: Pulmonary effort is normal.     Breath sounds: Normal breath sounds. No wheezing, rhonchi or rales.  Abdominal:     General: There is no distension.     Palpations: Abdomen is soft.     Tenderness: There is no abdominal tenderness. There is no guarding or rebound.  Musculoskeletal:        General: Normal range of motion.     Cervical back: Normal range of motion and neck supple.  Skin:    General: Skin is warm and dry.  Neurological:     General: No focal deficit present.     Mental Status: She is alert and oriented to person, place, and time.  ED Results / Procedures / Treatments   Labs (all labs ordered are listed, but only abnormal results are displayed) Labs Reviewed  CBC WITH DIFFERENTIAL/PLATELET - Abnormal; Notable for the following components:      Result Value   Hemoglobin 11.0 (*)    HCT 34.2 (*)    MCH 25.9 (*)    All other components within normal limits  COMPREHENSIVE METABOLIC PANEL WITH GFR - Abnormal; Notable for the following components:   Glucose, Bld 100 (*)    Calcium  8.6 (*)    AST 14 (*)    All other components within normal limits  RESP PANEL BY RT-PCR (RSV, FLU A&B, COVID)  [id number]  LIPASE, BLOOD  URINALYSIS, ROUTINE W REFLEX MICROSCOPIC  PREGNANCY, URINE   Results for orders placed or performed during the hospital encounter of 10/11/23  Resp panel by RT-PCR (RSV,  Flu A&B, Covid) Anterior Nasal Swab   Collection Time: 10/11/23  9:45 PM   Specimen: Anterior Nasal Swab  Result Value Ref Range   SARS Coronavirus 2 by RT PCR NEGATIVE NEGATIVE   Influenza A by PCR NEGATIVE NEGATIVE   Influenza B by PCR NEGATIVE NEGATIVE   Resp Syncytial Virus by PCR NEGATIVE NEGATIVE  CBC with Differential   Collection Time: 10/11/23 10:31 PM  Result Value Ref Range   WBC 8.7 4.0 - 10.5 K/uL   RBC 4.24 3.87 - 5.11 MIL/uL   Hemoglobin 11.0 (L) 12.0 - 15.0 g/dL   HCT 62.9 (L) 52.8 - 41.3 %   MCV 80.7 80.0 - 100.0 fL   MCH 25.9 (L) 26.0 - 34.0 pg   MCHC 32.2 30.0 - 36.0 g/dL   RDW 24.4 01.0 - 27.2 %   Platelets 316 150 - 400 K/uL   nRBC 0.0 0.0 - 0.2 %   Neutrophils Relative % 55 %   Neutro Abs 4.8 1.7 - 7.7 K/uL   Lymphocytes Relative 35 %   Lymphs Abs 3.0 0.7 - 4.0 K/uL   Monocytes Relative 7 %   Monocytes Absolute 0.6 0.1 - 1.0 K/uL   Eosinophils Relative 2 %   Eosinophils Absolute 0.2 0.0 - 0.5 K/uL   Basophils Relative 1 %   Basophils Absolute 0.0 0.0 - 0.1 K/uL   Immature Granulocytes 0 %   Abs Immature Granulocytes 0.02 0.00 - 0.07 K/uL  Comprehensive metabolic panel   Collection Time: 10/11/23 10:31 PM  Result Value Ref Range   Sodium 138 135 - 145 mmol/L   Potassium 4.0 3.5 - 5.1 mmol/L   Chloride 103 98 - 111 mmol/L   CO2 25 22 - 32 mmol/L   Glucose, Bld 100 (H) 70 - 99 mg/dL   BUN 13 6 - 20 mg/dL   Creatinine, Ser 5.36 0.44 - 1.00 mg/dL   Calcium  8.6 (L) 8.9 - 10.3 mg/dL   Total Protein 6.7 6.5 - 8.1 g/dL   Albumin 4.0 3.5 - 5.0 g/dL   AST 14 (L) 15 - 41 U/L   ALT 9 0 - 44 U/L   Alkaline Phosphatase 57 38 - 126 U/L   Total Bilirubin 0.2 0.0 - 1.2 mg/dL   GFR, Estimated >64 >40 mL/min   Anion gap 9 5 - 15  Lipase, blood   Collection Time: 10/11/23 10:31 PM  Result Value Ref Range   Lipase 27 11 - 51 U/L   vir  EKG None  Radiology DG Chest 2 View Result Date: 10/11/2023 CLINICAL DATA:  Weakness EXAM: CHEST - 2 VIEW COMPARISON:  12/07/2019 FINDINGS: The heart size and mediastinal contours are within normal limits. Both lungs are clear. The visualized skeletal structures are unremarkable. IMPRESSION: No active cardiopulmonary disease. Electronically Signed   By: Janeece Mechanic M.D.   On: 10/11/2023 23:09    Procedures Procedures    Medications Ordered in ED Medications  sodium chloride  0.9 % bolus 1,000 mL (has no administration in time range)  sodium chloride  0.9 % bolus 1,000 mL (0 mLs Intravenous Stopped 10/11/23 2344)  ondansetron  (ZOFRAN ) injection 4 mg (4 mg Intravenous Given 10/11/23 2229)    ED Course/ Medical Decision Making/ A&P                                 Medical Decision Making This patient presents to the ED for concern of vomiting and diarrhea, this involves an extensive number of treatment options, and is a complaint that carries with it a high risk of complications and morbidity.  The differential diagnosis includes gastroenteritis, obstruction, cholecystitis, PUD   Co morbidities that complicate the patient evaluation  History of anemia without history of transfusion   Additional history obtained:  Additional history and/or information obtained from chart review, notable for No history of recurrent similar symptoms.    Lab Tests:  I Ordered, and personally interpreted labs.  The pertinent results include:  viral panel negative; normal CBC except for hgb 11.0, improved from last value; CMET normal.     Imaging Studies ordered:  I ordered imaging studies including CXR Per radiologist interpretation:    Cardiac Monitoring:  The patient was maintained on a cardiac monitor.  I personally viewed and interpreted the cardiac monitored which showed an underlying rhythm of: na/   Medicines ordered and prescription drug management:  I ordered medication including Zofran   for nausea Reevaluation of the patient after these medicines showed that the patient improved I have reviewed the  patients home medicines and have made adjustments as needed   Test Considered:  N/a   Critical Interventions:  N/a   Consultations Obtained:  I requested consultation with the n/a,  and discussed lab and imaging findings as well as pertinent plan - they recommend: n/a   Problem List / ED Course:  Progressively worsening symptoms of generalized weakness, vomiting, diarrhea, decreased PO intake No fever IV fluids, labs pending Zofran  provided   Reevaluation:  After the interventions noted above, I reevaluated the patient and found that they have :improved   Social Determinants of Health:  Never a smoker   Disposition:  After consideration of the diagnostic results and the patients response to treatment, I feel that the patient would benefit from:  Patient care signed out to Dr. Lula Sale for re-evaluation after UA, urine pregnancy resulted. .   Amount and/or Complexity of Data Reviewed Labs: ordered. Radiology: ordered.  Risk Prescription drug management.           Final Clinical Impression(s) / ED Diagnoses Final diagnoses:  Weakness    Rx / DC Orders ED Discharge Orders     None         Mandy Second, PA-C 10/12/23 0037    Orvilla Blander, MD 10/12/23 0201

## 2023-10-11 NOTE — ED Triage Notes (Signed)
 Pt c/o dizziness, general weakness, HAs, NV since Friday

## 2023-10-12 LAB — URINALYSIS, ROUTINE W REFLEX MICROSCOPIC
Bilirubin Urine: NEGATIVE
Glucose, UA: NEGATIVE mg/dL
Hgb urine dipstick: NEGATIVE
Ketones, ur: NEGATIVE mg/dL
Leukocytes,Ua: NEGATIVE
Nitrite: NEGATIVE
Protein, ur: NEGATIVE mg/dL
Specific Gravity, Urine: 1.015 (ref 1.005–1.030)
pH: 6 (ref 5.0–8.0)

## 2023-10-12 LAB — PREGNANCY, URINE: Preg Test, Ur: NEGATIVE

## 2023-10-12 MED ORDER — SODIUM CHLORIDE 0.9 % IV BOLUS
1000.0000 mL | Freq: Once | INTRAVENOUS | Status: AC
  Administered 2023-10-12: 1000 mL via INTRAVENOUS

## 2023-10-12 MED ORDER — ONDANSETRON 8 MG PO TBDP: ORAL_TABLET | ORAL | 0 refills | Status: AC

## 2023-10-12 NOTE — ED Notes (Signed)
 This RN went in & asked pt if she could provide a urine sample. Pt doesn't respond. RN asked again and stated that's what we need in order to get her discharged. Pt shook her head and closed her eyes.

## 2023-10-12 NOTE — Discharge Instructions (Addendum)
 Begin taking Zofran  as prescribed as needed for nausea.  Clear liquids for the next 12 hours, then slowly advance diet as tolerated.  Follow-up with primary doctor and return to the ER if your symptoms significantly worsen or change.
